# Patient Record
Sex: Male | Born: 1985
Health system: Southern US, Community
[De-identification: ages and names within clinical notes are randomized; demographics above are authoritative.]

## PROBLEM LIST (undated history)

## (undated) DIAGNOSIS — J45909 Unspecified asthma, uncomplicated: Secondary | ICD-10-CM

## (undated) HISTORY — PX: OTHER SURGICAL HISTORY: SHX169

---
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
## 2007-08-16 ENCOUNTER — Emergency Department (HOSPITAL_COMMUNITY): Admission: EM | Admit: 2007-08-16 | Discharge: 2007-08-16 | Payer: Self-pay | Admitting: Neurosurgery

## 2008-04-09 ENCOUNTER — Emergency Department (HOSPITAL_BASED_OUTPATIENT_CLINIC_OR_DEPARTMENT_OTHER): Admission: EM | Admit: 2008-04-09 | Discharge: 2008-04-09 | Payer: Self-pay | Admitting: Emergency Medicine

## 2009-12-06 ENCOUNTER — Emergency Department (HOSPITAL_BASED_OUTPATIENT_CLINIC_OR_DEPARTMENT_OTHER): Admission: EM | Admit: 2009-12-06 | Discharge: 2009-12-06 | Payer: Self-pay | Admitting: Emergency Medicine

## 2009-12-13 IMAGING — CR DG CHEST 2V
2 series · 2 of 2 positions shown · non-contrast
Comparison: 09/12/2004

CLINICAL DATA: Cough, low grade fever, asthma, shortness of breath

CHEST - 2 VIEW

[w chest pa]
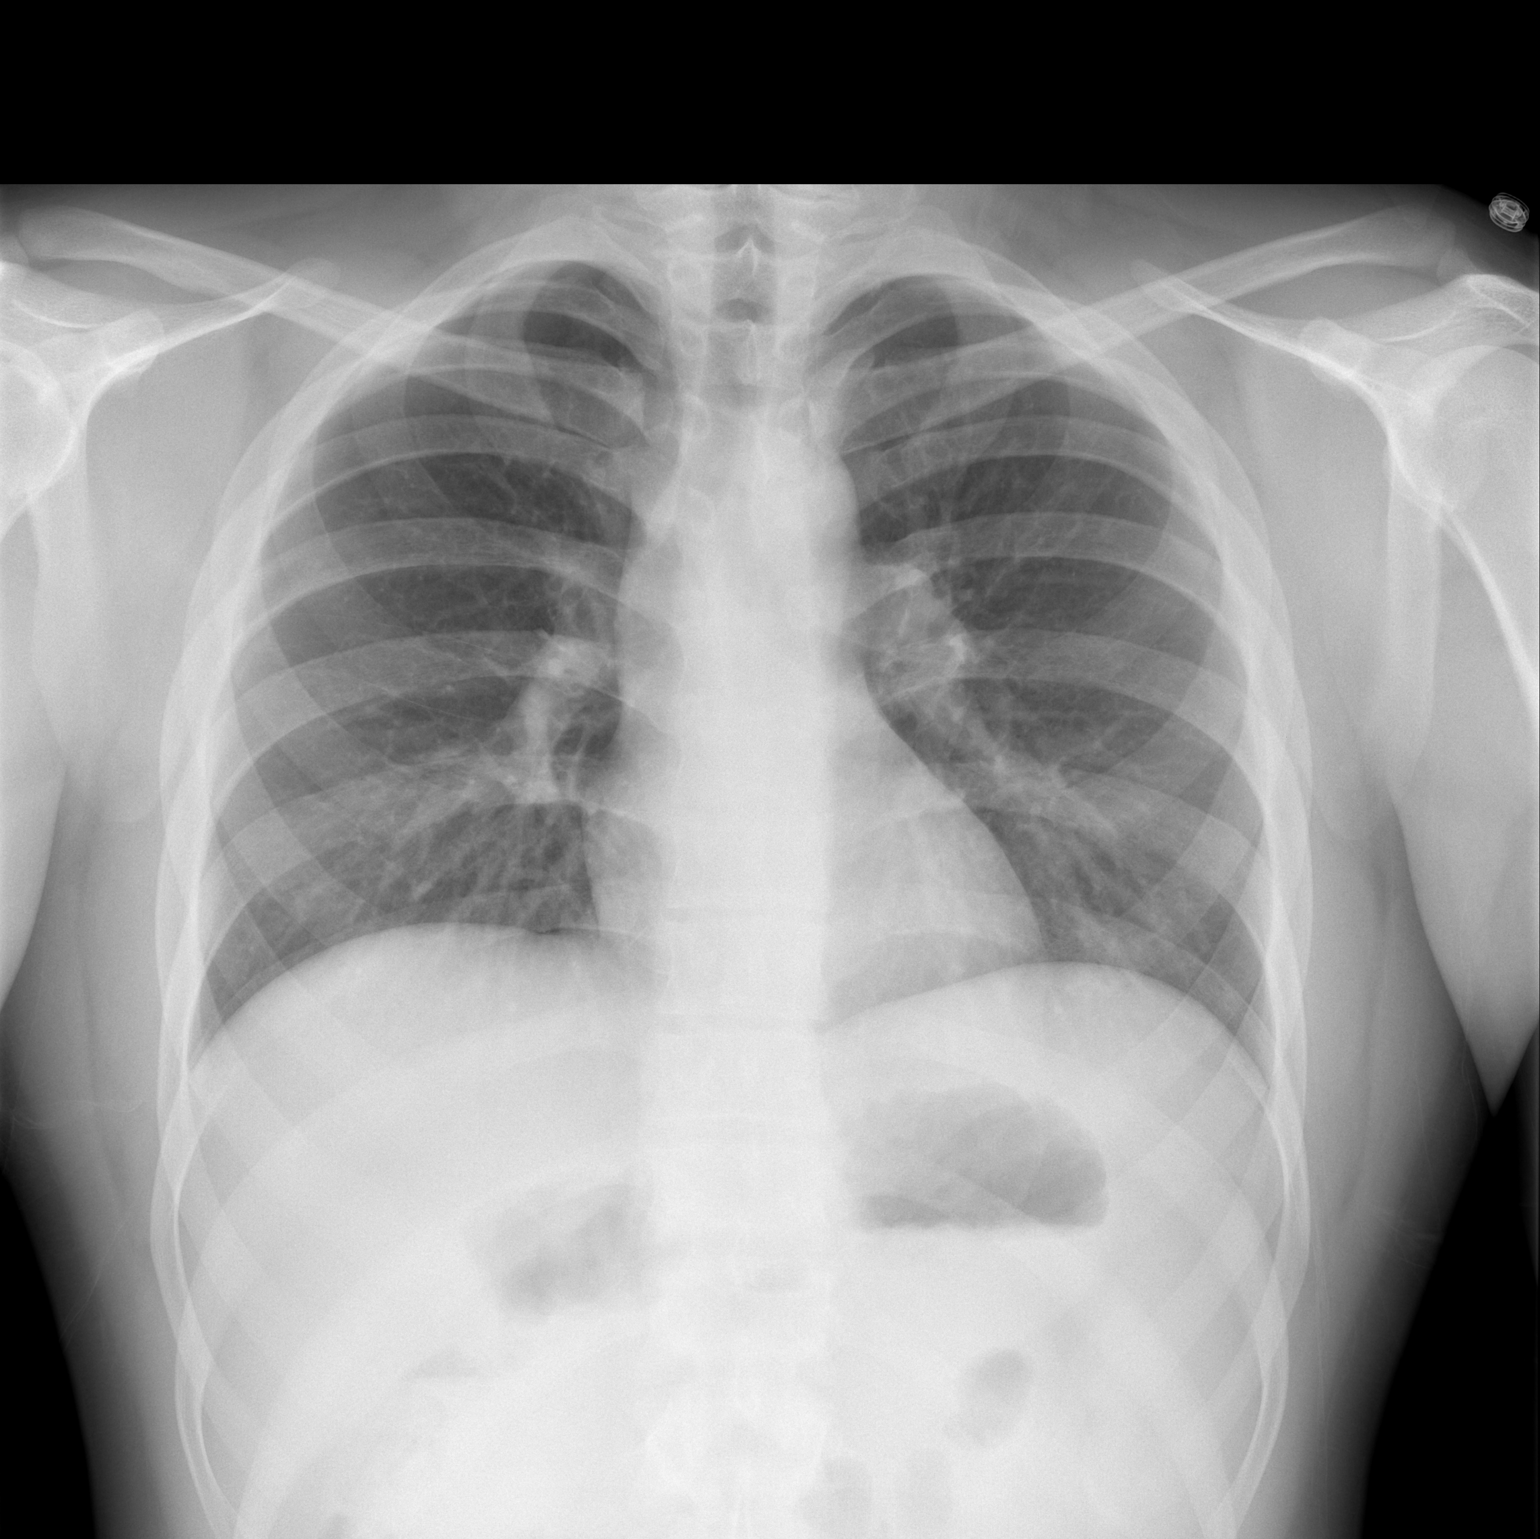

[w chest lat]
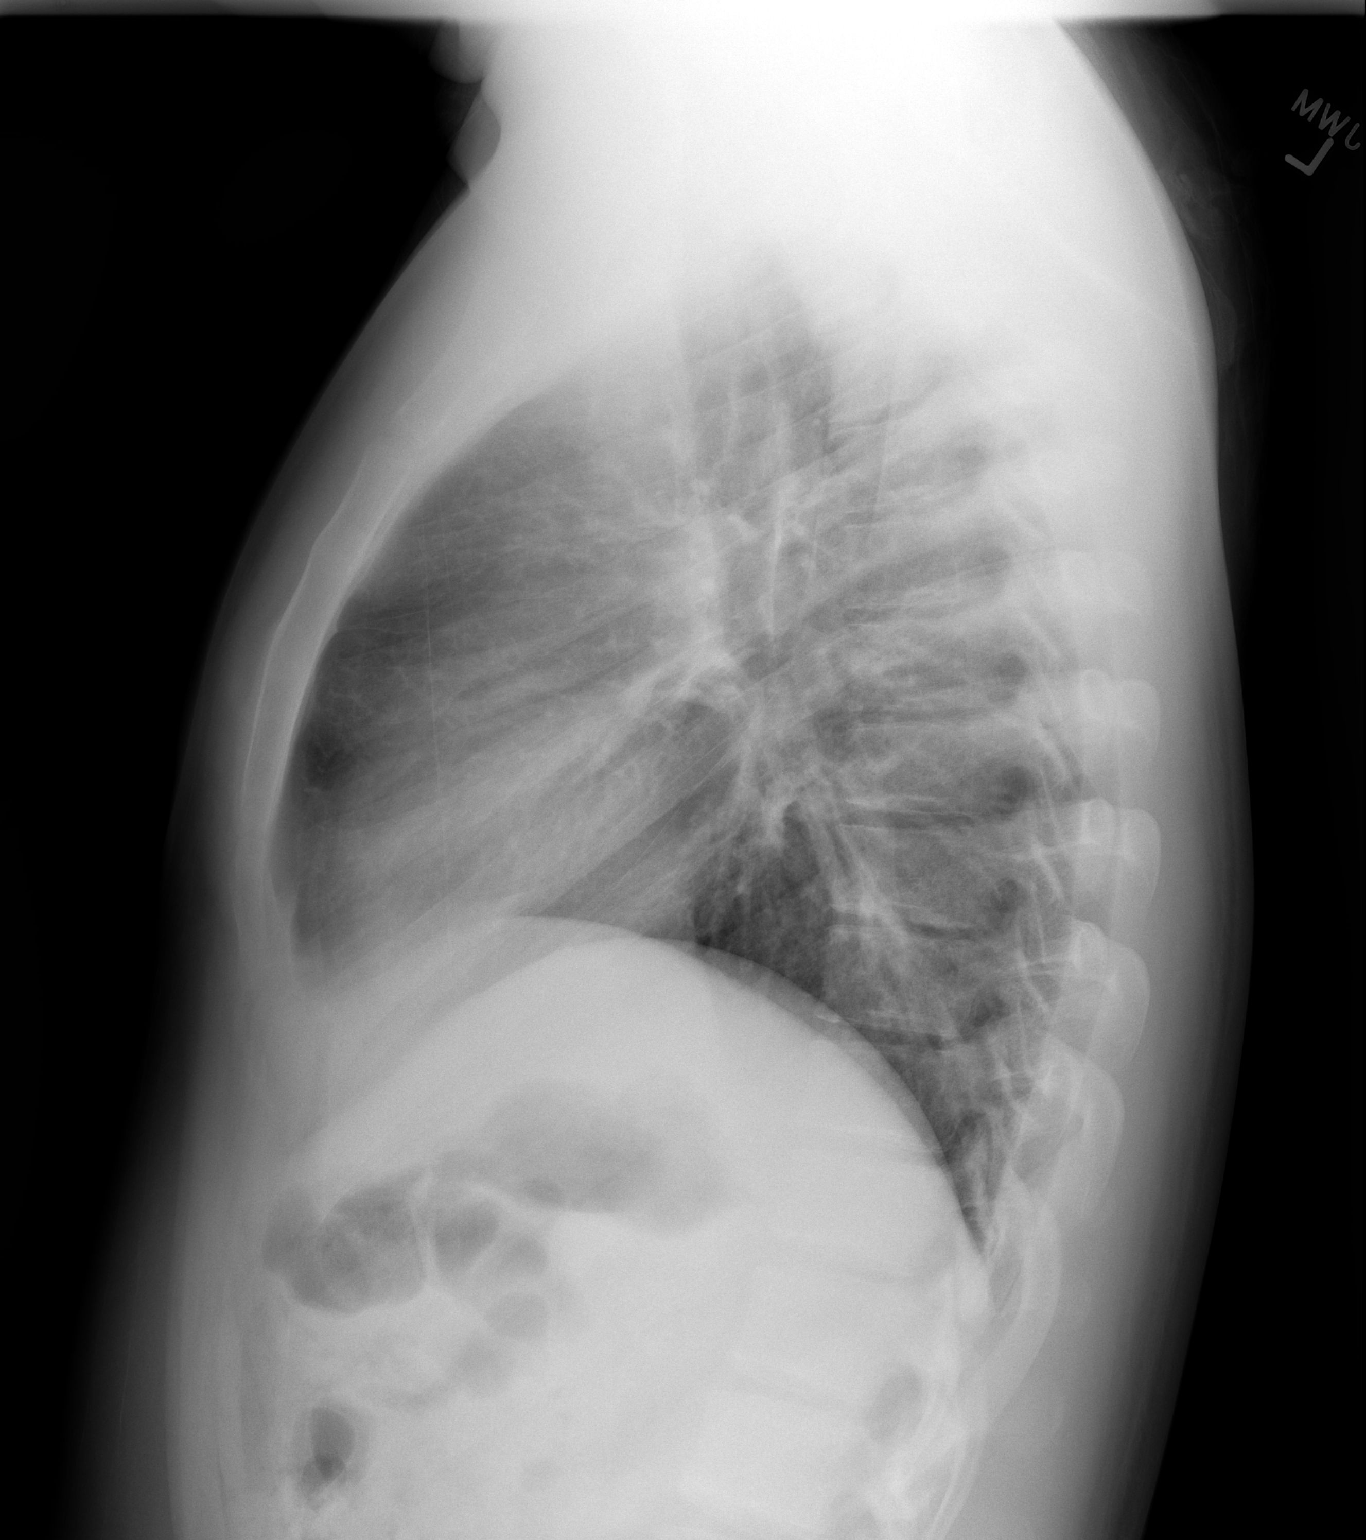

[2 of 2 positions shown; findings below may reference images not displayed]

FINDINGS: Normal heart size, mediastinal contours, and pulmonary vascularity.
Mild peribronchial thickening.
No pulmonary infiltrate, pleural effusion, or pneumothorax.
Bones unremarkable.
IMPRESSION: Minimal chronic bronchitic changes without acute infiltrate.
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
## 2010-11-05 NOTE — Discharge Summary (Signed)
NAME:  Brett Brock, Brett Brock              ACCOUNT NO.:  [id number]   MEDICAL RECORD NO.:  [id number]          PATIENT TYPE:  INP   LOCATION:  6149                         FACILITY:  [facility]   PHYSICIAN:  Ermalinda Barrios, M.D.  DATE OF BIRTH:  1985/08/12   DATE OF ADMISSION:  09/12/2004  DATE OF DISCHARGE:  09/13/2004                                 DISCHARGE SUMMARY   HOSPITAL COURSE:  A 25 year old male admitted for asthma exacerbation,  received prednisone and albuterol in ED, still with respiratory distress.  Started on albuterol q.4h. with q.2 p.r.n. and oxygen to keep saturation  above 92%.  Also started maintenance IV fluids and allowed regular diet as  tolerated.  On day of discharge the patient was breathing comfortably with  no oxygen requirement.  He did not require p.r.n. albuterol overnight.  Strep test was positive.  He was treated with Bicillin x1.  The patient had  continued throat soreness and was given ibuprofen and Chloraseptic spray for  pain.   OPERATIONS AND PROCEDURES:  Chest x-ray showed mild hypoinflation, no  infiltrate.  Rapid strep test positive.  Chem labs within normal limits.   DIAGNOSES:  Asthma exacerbation, strep throat.   MEDICATIONS:  1.  Albuterol inhaler q.4-6h. p.r.n.  2. Flovent 44 mcg two puffs b.i.d.  3.      Prednisone 60 mg daily x3 days.   DISCHARGE WEIGHT:  94.2 kg.   DISCHARGE CONDITION:  Good.   DISCHARGE INSTRUCTIONS:  Follow up with Dr. Alita Chyle on Tuesday, 4/4 at  9:40 a.m.  Daily peak flows.      PR/MEDQ  D:  09/13/2004  T:  09/13/2004  Job:  [id number]

## 2011-03-11 LAB — INFLUENZA A AND B ANTIGEN (CONVERTED LAB): Influenza B Ag: NEGATIVE

## 2018-01-21 ENCOUNTER — Encounter (HOSPITAL_COMMUNITY): Payer: Self-pay | Admitting: *Deleted

## 2018-01-21 ENCOUNTER — Other Ambulatory Visit: Payer: Self-pay

## 2018-01-21 ENCOUNTER — Emergency Department (HOSPITAL_COMMUNITY)
Admission: EM | Admit: 2018-01-21 | Discharge: 2018-01-21 | Disposition: A | Discharge status: Home / Self Care | Payer: Self-pay | Attending: Emergency Medicine | Admitting: Emergency Medicine

## 2018-01-21 DIAGNOSIS — J45909 Unspecified asthma, uncomplicated: Secondary | ICD-10-CM | POA: Insufficient documentation

## 2018-01-21 DIAGNOSIS — K2970 Gastritis, unspecified, without bleeding: Secondary | ICD-10-CM | POA: Insufficient documentation

## 2018-01-21 HISTORY — DX: Unspecified asthma, uncomplicated: J45.909

## 2018-01-21 LAB — CBC WITH DIFFERENTIAL/PLATELET
BASOS ABS: 0.1 10*3/uL (ref 0.0–0.1)
BASOS PCT: 2 %
EOS ABS: 0.4 10*3/uL (ref 0.0–0.7)
Eosinophils Relative: 5 %
HEMATOCRIT: 48.6 % (ref 39.0–52.0)
HEMOGLOBIN: 16 g/dL (ref 13.0–17.0)
Lymphocytes Relative: 22 %
Lymphs Abs: 2 10*3/uL (ref 0.7–4.0)
MCH: 32.1 pg (ref 26.0–34.0)
MCHC: 32.9 g/dL (ref 30.0–36.0)
MCV: 97.6 fL (ref 78.0–100.0)
MONOS PCT: 10 %
Monocytes Absolute: 0.9 10*3/uL (ref 0.1–1.0)
NEUTROS ABS: 5.5 10*3/uL (ref 1.7–7.7)
NEUTROS PCT: 61 %
Platelets: 279 10*3/uL (ref 150–400)
RBC: 4.98 MIL/uL (ref 4.22–5.81)
RDW: 14.1 % (ref 11.5–15.5)
WBC: 8.9 10*3/uL (ref 4.0–10.5)

## 2018-01-21 LAB — COMPREHENSIVE METABOLIC PANEL
ALBUMIN: 3.7 g/dL (ref 3.5–5.0)
ALK PHOS: 93 U/L (ref 38–126)
ALT: 121 U/L — AB (ref 0–44)
AST: 146 U/L — AB (ref 15–41)
Anion gap: 12 (ref 5–15)
BILIRUBIN TOTAL: 1 mg/dL (ref 0.3–1.2)
BUN: 6 mg/dL (ref 6–20)
CALCIUM: 9.2 mg/dL (ref 8.9–10.3)
CO2: 25 mmol/L (ref 22–32)
CREATININE: 0.64 mg/dL (ref 0.61–1.24)
Chloride: 104 mmol/L (ref 98–111)
GFR calc Af Amer: >60 mL/min (ref >60)
GFR calc non Af Amer: >60 mL/min (ref >60)
GLUCOSE: 126 mg/dL — AB (ref 70–99)
Potassium: 3.3 mmol/L — ABNORMAL LOW (ref 3.5–5.1)
SODIUM: 141 mmol/L (ref 135–145)
TOTAL PROTEIN: 7.4 g/dL (ref 6.5–8.1)

## 2018-01-21 NOTE — ED Triage Notes (Signed)
Pt reports he was lying in bed about and hour ago and suddenly became nauseated and vomited x1. Reports some blood in his emesis. Says he feels a burning in his throat "like acid reflux"

## 2018-01-21 NOTE — ED Notes (Signed)
Bed: WA06 Expected date:  Expected time:  Means of arrival:  Comments: 

## 2018-01-21 NOTE — ED Notes (Signed)
Pt tolerated PO challenge

## 2018-01-21 NOTE — ED Provider Notes (Signed)
[facility]-EMERGENCY DEPT Provider Note   CSN: [id number] Arrival date & time: 01/21/18  0406     History   Chief Complaint Chief Complaint  Patient presents with  . Emesis    HPI Brett Brock is a 32 y.o. male with a past medical history of asthma, alcohol abuse presents to ED for evaluation of one episode of nausea and blood-tinged emesis that occurred prior to arrival.  States that he was laying in bed when the symptoms suddenly happened.  States that he had a discomfort in his abdomen when he had the nausea which improved when he vomited.  Patient states that "I did not want to come, but my wife and mom made me."  He denies any symptoms currently.  He states that his blood pressure is always high at the doctor's office and because "my wife is stressing me out."  Denies any diarrhea, constipation, fever, current abdominal pain, sick contacts with similar symptoms, chest pain or shortness of breath.  He has not taken any medicine to help with his symptoms.  Patient reports drinking 2-3 beers and 10 shots of whiskey daily. States "I only drank 2 beers last night."  HPI  Past Medical History:  Diagnosis Date  . Asthma     There are no active problems to display for this patient.   History reviewed. No pertinent surgical history.      Home Medications    Prior to Admission medications   Medication Sig Start Date End Date Taking? Authorizing Provider  ALBUTEROL IN Inhale 2 puffs into the lungs daily as needed (beathing difficulties).   Yes [provider]    Family History No family history on file.  Social History Social History   Tobacco Use  . Smoking status: Never Smoker  . Smokeless tobacco: Never Used  Substance Use Topics  . Alcohol use: Yes  . Drug use: Never     Allergies   Bee venom   Review of Systems Review of Systems  Constitutional: Negative for appetite change, chills and fever.  HENT: Negative for ear pain,  rhinorrhea, sneezing and sore throat.   Eyes: Negative for photophobia and visual disturbance.  Respiratory: Negative for cough, chest tightness, shortness of breath and wheezing.   Cardiovascular: Negative for chest pain and palpitations.  Gastrointestinal: Positive for abdominal pain, nausea and vomiting. Negative for blood in stool, constipation and diarrhea.  Genitourinary: Negative for dysuria, hematuria and urgency.  Musculoskeletal: Negative for myalgias.  Skin: Negative for rash.  Neurological: Negative for dizziness, weakness and light-headedness.     Physical Exam Updated Vital Signs BP (!) 170/99 (BP Location: Right Arm) Comment: Notifed PA before Pt's departure   Pulse 91   Temp 98.4 F (36.9 C) (Oral)   Resp 16   Ht 6' (1.829 m)   Wt 122.5 kg (270 lb)   SpO2 100%   BMI 36.62 kg/m   Physical Exam  Constitutional: He appears well-developed and well-nourished. No distress.  HENT:  Head: Normocephalic and atraumatic.  Nose: Nose normal.  Eyes: Conjunctivae and EOM are normal. Right eye exhibits no discharge. Left eye exhibits no discharge. No scleral icterus.  Neck: Normal range of motion. Neck supple.  Cardiovascular: Normal rate, regular rhythm, normal heart sounds and intact distal pulses. Exam reveals no gallop and no friction rub.  No murmur heard. Pulmonary/Chest: Effort normal and breath sounds normal. No respiratory distress.  Abdominal: Soft. Bowel sounds are normal. He exhibits no distension. There is  no tenderness. There is no guarding.  No abdominal TTP. Specifically, no RUQ TTP.  Musculoskeletal: Normal range of motion. He exhibits no edema.  Neurological: He is alert. He exhibits normal muscle tone. Coordination normal.  Skin: Skin is warm and dry. No rash noted.  Psychiatric: He has a normal mood and affect.  Nursing note and vitals reviewed.    ED Treatments / Results  Labs (all labs ordered are listed, but only abnormal results are  displayed) Labs Reviewed  COMPREHENSIVE METABOLIC PANEL - Abnormal; Notable for the following components:      Result Value   Potassium 3.3 (*)    Glucose, Bld 126 (*)    AST 146 (*)    ALT 121 (*)    All other components within normal limits  CBC WITH DIFFERENTIAL/PLATELET    EKG None  Radiology No results found.  Procedures Procedures (including critical care time)  Medications Ordered in ED Medications - No data to display   Initial Impression / Assessment and Plan / ED Course  I have reviewed the triage vital signs and the nursing notes.  Pertinent labs & imaging results that were available during my care of the patient were reviewed by me and considered in my medical decision making (see chart for details).     32 year old male with past medical history of asthma, alcohol abuse presents to ED for evaluation of 1 episode of nausea and blood-tinged emesis prior to arrival.  Upon arrival to the ED, patient states that his symptoms have completely resolved.  He is able to tolerate p.o. intake without difficulty.  Patient is hypertensive but states that this is secondary to stress and being at the hospital.  Lab work significant for AST and ALT elevation.  CBC unremarkable.  His abdomen is soft, nontender and nondistended.  LFT elevations could be explained by his daily alcohol use which he endorses past 2-3 beers daily and 10 shots of whiskey.  I offered him a right upper quadrant ultrasound to evaluate for gallbladder pathology with patient does not feel that any further work-up or imaging is warranted.  States that he was hospitalized for pneumonia at the beginning of the year and "they checked everything out and told me I was okay, I just had a fatty liver." I suspect symptoms could be 2/2 alcohol use.  He is able to tolerate p.o. intake without difficulty.  Will advise him to follow-up with PCP and to return to ED for any severe worsening symptoms. Doubt emergent/surgical cause  of symptoms. Patient's blood pressure initially elevated in the emergency department today. Patient denies headache, change in vision, numbness, weakness, chest pain, dyspnea, dizziness, or lightheadedness therefore doubt hypertensive emergency. Discussed elevated blood pressure with the patient and the need for primary care follow up with potential need to initiate or change antihypertensive medications and or for further evaluation. Discussed return precaution signs/symptoms for hypertensive emergency as listed above with the patient.   Portions of this note were generated with Scientist, clinical (histocompatibility and immunogenetics)Dragon dictation software. Dictation errors may occur despite best attempts at proofreading.   Final Clinical Impressions(s) / ED Diagnoses   Final diagnoses:  Gastritis without bleeding, unspecified chronicity, unspecified gastritis type    ED Discharge Orders    None       Dietrich PatesKhatri, Jeslyn Amsler, PA-C 01/21/18 [id number]    Dione BoozeGlick, David, MD 01/21/18 [phone]

## 2018-01-21 NOTE — Discharge Instructions (Signed)
Return to ED for worsening symptoms, increased vomiting, severe abdominal pain, chest pain, lightheadedness or loss of consciousness.

## 2018-11-07 ENCOUNTER — Emergency Department (HOSPITAL_COMMUNITY): Payer: Self-pay

## 2018-11-07 ENCOUNTER — Emergency Department (HOSPITAL_COMMUNITY)
Admission: EM | Admit: 2018-11-07 | Discharge: 2018-11-07 | Disposition: A | Discharge status: Home / Self Care | Payer: Self-pay | Attending: Emergency Medicine | Admitting: Emergency Medicine

## 2018-11-07 ENCOUNTER — Other Ambulatory Visit: Payer: Self-pay

## 2018-11-07 ENCOUNTER — Encounter (HOSPITAL_COMMUNITY): Payer: Self-pay | Admitting: Emergency Medicine

## 2018-11-07 DIAGNOSIS — R0602 Shortness of breath: Secondary | ICD-10-CM | POA: Insufficient documentation

## 2018-11-07 DIAGNOSIS — E669 Obesity, unspecified: Secondary | ICD-10-CM | POA: Insufficient documentation

## 2018-11-07 DIAGNOSIS — Z6838 Body mass index (BMI) 38.0-38.9, adult: Secondary | ICD-10-CM | POA: Insufficient documentation

## 2018-11-07 DIAGNOSIS — G4730 Sleep apnea, unspecified: Secondary | ICD-10-CM | POA: Insufficient documentation

## 2018-11-07 DIAGNOSIS — L03116 Cellulitis of left lower limb: Secondary | ICD-10-CM | POA: Insufficient documentation

## 2018-11-07 DIAGNOSIS — R609 Edema, unspecified: Secondary | ICD-10-CM

## 2018-11-07 LAB — CBC WITH DIFFERENTIAL/PLATELET
Abs Immature Granulocytes: 0.02 10*3/uL (ref 0.00–0.07)
Basophils Absolute: 0.1 10*3/uL (ref 0.0–0.1)
Basophils Relative: 2 %
Eosinophils Absolute: 0.3 10*3/uL (ref 0.0–0.5)
Eosinophils Relative: 4 %
HCT: 47.5 % (ref 39.0–52.0)
Hemoglobin: 15.4 g/dL (ref 13.0–17.0)
Immature Granulocytes: 0 %
Lymphocytes Relative: 24 %
Lymphs Abs: 1.7 10*3/uL (ref 0.7–4.0)
MCH: 31.4 pg (ref 26.0–34.0)
MCHC: 32.4 g/dL (ref 30.0–36.0)
MCV: 96.9 fL (ref 80.0–100.0)
Monocytes Absolute: 0.6 10*3/uL (ref 0.1–1.0)
Monocytes Relative: 9 %
Neutro Abs: 4.1 10*3/uL (ref 1.7–7.7)
Neutrophils Relative %: 61 %
Platelets: 205 10*3/uL (ref 150–400)
RBC: 4.9 MIL/uL (ref 4.22–5.81)
RDW: 14.1 % (ref 11.5–15.5)
WBC: 6.8 10*3/uL (ref 4.0–10.5)
nRBC: 0 % (ref 0.0–0.2)

## 2018-11-07 LAB — COMPREHENSIVE METABOLIC PANEL
ALT: 187 U/L — ABNORMAL HIGH (ref 0–44)
AST: 236 U/L — ABNORMAL HIGH (ref 15–41)
Albumin: 3.6 g/dL (ref 3.5–5.0)
Alkaline Phosphatase: 101 U/L (ref 38–126)
Anion gap: 13 (ref 5–15)
BUN: 5 mg/dL — ABNORMAL LOW (ref 6–20)
CO2: 23 mmol/L (ref 22–32)
Calcium: 8.7 mg/dL — ABNORMAL LOW (ref 8.9–10.3)
Chloride: 103 mmol/L (ref 98–111)
Creatinine, Ser: 0.65 mg/dL (ref 0.61–1.24)
GFR calc Af Amer: >60 mL/min (ref >60)
GFR calc non Af Amer: >60 mL/min (ref >60)
Glucose, Bld: 173 mg/dL — ABNORMAL HIGH (ref 70–99)
Potassium: 3.2 mmol/L — ABNORMAL LOW (ref 3.5–5.1)
Sodium: 139 mmol/L (ref 135–145)
Total Bilirubin: 1 mg/dL (ref 0.3–1.2)
Total Protein: 7.2 g/dL (ref 6.5–8.1)

## 2018-11-07 LAB — D-DIMER, QUANTITATIVE (NOT AT ARMC): D-Dimer, Quant: 0.48 ug/mL-FEU (ref 0.00–0.50)

## 2018-11-07 MED ORDER — CEPHALEXIN 500 MG PO CAPS: 500.0000 mg | ORAL_CAPSULE | Freq: Four times a day (QID) | ORAL | 0 refills | Status: AC

## 2018-11-07 MED ORDER — CEPHALEXIN 250 MG PO CAPS
500.0000 mg | ORAL_CAPSULE | Freq: Once | ORAL | Status: AC
  Administered 2018-11-07: 500 mg via ORAL
  Filled 2018-11-07: qty 2

## 2018-11-07 MED ORDER — ALBUTEROL SULFATE HFA 108 (90 BASE) MCG/ACT IN AERS
6.0000 | INHALATION_SPRAY | Freq: Once | RESPIRATORY_TRACT | Status: AC
  Administered 2018-11-07: 8 via RESPIRATORY_TRACT
  Filled 2018-11-07: qty 6.7

## 2018-11-07 NOTE — ED Provider Notes (Signed)
[facility]   CSN: [id number] Arrival date & time: 11/07/18  1539    History   Chief Complaint Chief Complaint  Patient presents with  . Leg Swelling    HPI Brett Brock is a 33 y.o. male.  With past medical history of asthma, alcohol abuse, sleep apnea on CPAP who presents to the ED with bilateral lower extremity swelling left worse than right.  States the swelling and redness started about 2 weeks ago and is slowly crept up his leg.  States last night he did not sleep with his CPAP machine and today has been short of breath.  Tried using his albuterol inhaler with minimal improvement.  Denies any fevers, cough, congestion, chest pain.      HPI  Past Medical History:  Diagnosis Date  . Asthma     There are no active problems to display for this patient.   History reviewed. No pertinent surgical history.      Home Medications    Prior to Admission medications   Medication Sig Start Date End Date Taking? Authorizing Provider  albuterol (VENTOLIN HFA) 108 (90 Base) MCG/ACT inhaler Inhale 1-2 puffs into the lungs every 6 (six) hours as needed for wheezing or shortness of breath.   Yes [provider]  cephALEXin (KEFLEX) 500 MG capsule Take 1 capsule (500 mg total) by mouth 4 (four) times daily for 7 days. 11/07/18 11/14/18  Dicky DoeFord, Emmali Karow, MD    Family History No family history on file.  Social History Social History   Tobacco Use  . Smoking status: Never Smoker  . Smokeless tobacco: Never Used  Substance Use Topics  . Alcohol use: Yes  . Drug use: Never     Allergies   Bee venom   Review of Systems Review of Systems  Constitutional: Negative for activity change, appetite change, chills and fever.  HENT: Negative for ear pain and sore throat.   Eyes: Negative for pain and visual disturbance.  Respiratory: Positive for shortness of breath. Negative for cough.   Cardiovascular: Negative for  chest pain and palpitations.  Gastrointestinal: Negative for abdominal pain, nausea and vomiting.  Genitourinary: Negative for dysuria and hematuria.  Musculoskeletal: Negative for arthralgias and back pain.       Bilateral lower extremity swelling. L>R with erythema to the left  Skin: Negative for color change and rash.  Neurological: Negative for seizures and syncope.  All other systems reviewed and are negative.    Physical Exam Updated Vital Signs BP (!) 154/110   Pulse 90   Temp 98 F (36.7 C) (Oral)   Resp 19   Ht 6' (1.829 m)   Wt 129.3 kg   SpO2 99%   BMI 38.65 kg/m   Physical Exam Vitals signs and nursing note reviewed.  Constitutional:      Appearance: He is well-developed. He is obese.  HENT:     Head: Normocephalic and atraumatic.  Eyes:     Conjunctiva/sclera: Conjunctivae normal.  Neck:     Musculoskeletal: Neck supple.  Cardiovascular:     Rate and Rhythm: Normal rate and regular rhythm.     Heart sounds: No murmur.  Pulmonary:     Effort: No respiratory distress.     Breath sounds: Normal breath sounds. No wheezing.     Comments: Tachypneic  Abdominal:     Palpations: Abdomen is soft.     Tenderness: There is no abdominal tenderness.  Musculoskeletal:  General: Swelling and tenderness present.     Comments: Bilateral lower extremity swelling below the knee with the left greater than the right with associated erythema. no signs of trauma to the area.  Skin:    General: Skin is warm and dry.  Neurological:     Mental Status: He is alert.      ED Treatments / Results  Labs (all labs ordered are listed, but only abnormal results are displayed) Labs Reviewed  COMPREHENSIVE METABOLIC PANEL - Abnormal; Notable for the following components:      Result Value   Potassium 3.2 (*)    Glucose, Bld 173 (*)    BUN 5 (*)    Calcium 8.7 (*)    AST 236 (*)    ALT 187 (*)    All other components within normal limits  CBC WITH  DIFFERENTIAL/PLATELET  D-DIMER, QUANTITATIVE (NOT AT [facility])    EKG EKG Interpretation  Date/Time:  Wednesday Nov 07 2018 15:47:46 EDT Ventricular Rate:  102 PR Interval:  142 QRS Duration: 82 QT Interval:  364 QTC Calculation: 474 R Axis:   66 Text Interpretation:  Sinus tachycardia no acute ST/T changes No old tracing to compare Confirmed by Pricilla Loveless [phone]) on 11/07/2018 8:04:32 PM   Radiology Dg Chest 2 View  Result Date: 11/07/2018 CLINICAL DATA:  Shortness of breath EXAM: CHEST - 2 VIEW COMPARISON:  April 09, 2008. FINDINGS: There is no appreciable edema or consolidation. Heart is upper normal in size with pulmonary vascularity normal. No adenopathy. No bone lesions. IMPRESSION: No edema or consolidation.  Heart upper normal in size. Electronically Signed   By: Bretta Bang III M.D.   On: 11/07/2018 16:23    Procedures Procedures (including critical care time)  Medications Ordered in ED Medications  albuterol (VENTOLIN HFA) 108 (90 Base) MCG/ACT inhaler 6-8 puff (8 puffs Inhalation Given 11/07/18 2045)  cephALEXin (KEFLEX) capsule 500 mg (500 mg Oral Given 11/07/18 2045)     Initial Impression / Assessment and Plan / ED Course  I have reviewed the triage vital signs and the nursing notes.  Pertinent labs & imaging results that were available during my care of the patient were reviewed by me and considered in my medical decision making (see chart for details).  Brett Brock is a 33 y.o. male.  With past medical history of asthma, alcohol abuse, sleep apnea on CPAP who presents to the ED with bilateral lower extremity swelling left worse than right.   On initial exam patient is slightly tachypneic with a heart rate in the 90s.  Exam as above shows bilateral lower extremity swelling distal to the knees with left greater than right.  The left has erythema extending up the calf.  In regards to shortness of breath patient has no wheezing and is moving air well  on exam albuterol inhaler given with mild improvement in symptoms.  Exam not consistent with asthma exacerbation.  D-dimer is negative patient is low to moderate [place] therefore likely PE.  Shortness of breath likely secondary to patient not sleeping with his CPAP last night.   Lower extremity more consistent with chronic venous stasis with superimposed left lower extremity cellulitis.  Patient given dose of Keflex in the ED and will be discharged with a 7-day course of Keflex.  Strict return precautions given to patient.  Information given per PCP as patient does not have one.  Advised patient that his blood pressure was persistently elevated during this visit and that he  needs close follow-up which patient states he will do.  Patient discharged home in stable condition.  All questions answered.   Final Clinical Impressions(s) / ED Diagnoses   Final diagnoses:  Cellulitis of left lower extremity  Peripheral edema    ED Discharge Orders         Ordered    cephALEXin (KEFLEX) 500 MG capsule  4 times daily     11/07/18 2104           Dicky Doe, MD 11/07/18 4081    Pricilla Loveless, MD 11/09/18 2016

## 2018-11-07 NOTE — ED Triage Notes (Signed)
Pt to ED with c/o bil lower leg swelling and redness x's 2 days.  Pt also c/o shortness of breath, hx of asthma  Pt uses c-pap at night but did not use it last night.

## 2018-11-07 NOTE — Discharge Instructions (Signed)
Blood pressure was elevated while in the ED. Please follow up with a PCP.

## 2019-05-14 ENCOUNTER — Encounter (HOSPITAL_COMMUNITY): Payer: Self-pay

## 2019-05-14 ENCOUNTER — Emergency Department (HOSPITAL_COMMUNITY): Payer: Self-pay

## 2019-05-14 ENCOUNTER — Emergency Department (HOSPITAL_COMMUNITY)
Admission: EM | Admit: 2019-05-14 | Discharge: 2019-05-15 | Disposition: A | Discharge status: Home / Self Care | Payer: Self-pay | Attending: Emergency Medicine | Admitting: Emergency Medicine

## 2019-05-14 ENCOUNTER — Other Ambulatory Visit: Payer: Self-pay

## 2019-05-14 DIAGNOSIS — R0789 Other chest pain: Secondary | ICD-10-CM | POA: Insufficient documentation

## 2019-05-14 DIAGNOSIS — F1490 Cocaine use, unspecified, uncomplicated: Secondary | ICD-10-CM | POA: Insufficient documentation

## 2019-05-14 DIAGNOSIS — Z8709 Personal history of other diseases of the respiratory system: Secondary | ICD-10-CM | POA: Insufficient documentation

## 2019-05-14 DIAGNOSIS — R0602 Shortness of breath: Secondary | ICD-10-CM | POA: Insufficient documentation

## 2019-05-14 DIAGNOSIS — I10 Essential (primary) hypertension: Secondary | ICD-10-CM | POA: Insufficient documentation

## 2019-05-14 DIAGNOSIS — F1020 Alcohol dependence, uncomplicated: Secondary | ICD-10-CM | POA: Insufficient documentation

## 2019-05-14 LAB — URINALYSIS, ROUTINE W REFLEX MICROSCOPIC
Bacteria, UA: NONE SEEN
Bilirubin Urine: NEGATIVE
Glucose, UA: NEGATIVE mg/dL
Hgb urine dipstick: NEGATIVE
Ketones, ur: NEGATIVE mg/dL
Leukocytes,Ua: NEGATIVE
Nitrite: NEGATIVE
Protein, ur: 30 mg/dL — AB
Specific Gravity, Urine: 1.017 (ref 1.005–1.030)
pH: 8 (ref 5.0–8.0)

## 2019-05-14 LAB — BRAIN NATRIURETIC PEPTIDE: B Natriuretic Peptide: 26.3 pg/mL (ref 0.0–100.0)

## 2019-05-14 LAB — HEPATIC FUNCTION PANEL
ALT: 141 U/L — ABNORMAL HIGH (ref 0–44)
AST: 195 U/L — ABNORMAL HIGH (ref 15–41)
Albumin: 3.7 g/dL (ref 3.5–5.0)
Alkaline Phosphatase: 97 U/L (ref 38–126)
Bilirubin, Direct: 0.6 mg/dL — ABNORMAL HIGH (ref 0.0–0.2)
Indirect Bilirubin: 1.8 mg/dL — ABNORMAL HIGH (ref 0.3–0.9)
Total Bilirubin: 2.4 mg/dL — ABNORMAL HIGH (ref 0.3–1.2)
Total Protein: 7.5 g/dL (ref 6.5–8.1)

## 2019-05-14 LAB — RAPID URINE DRUG SCREEN, HOSP PERFORMED
Amphetamines: NOT DETECTED
Barbiturates: NOT DETECTED
Benzodiazepines: NOT DETECTED
Cocaine: POSITIVE — AB
Opiates: NOT DETECTED
Tetrahydrocannabinol: NOT DETECTED

## 2019-05-14 LAB — BASIC METABOLIC PANEL
Anion gap: 12 (ref 5–15)
BUN: 5 mg/dL — ABNORMAL LOW (ref 6–20)
CO2: 26 mmol/L (ref 22–32)
Calcium: 9.3 mg/dL (ref 8.9–10.3)
Chloride: 100 mmol/L (ref 98–111)
Creatinine, Ser: 0.77 mg/dL (ref 0.61–1.24)
GFR calc Af Amer: >60 mL/min (ref >60)
GFR calc non Af Amer: >60 mL/min (ref >60)
Glucose, Bld: 160 mg/dL — ABNORMAL HIGH (ref 70–99)
Potassium: 3.4 mmol/L — ABNORMAL LOW (ref 3.5–5.1)
Sodium: 138 mmol/L (ref 135–145)

## 2019-05-14 LAB — CBC
HCT: 46.4 % (ref 39.0–52.0)
Hemoglobin: 15.3 g/dL (ref 13.0–17.0)
MCH: 31.6 pg (ref 26.0–34.0)
MCHC: 33 g/dL (ref 30.0–36.0)
MCV: 95.9 fL (ref 80.0–100.0)
Platelets: 162 10*3/uL (ref 150–400)
RBC: 4.84 MIL/uL (ref 4.22–5.81)
RDW: 14.6 % (ref 11.5–15.5)
WBC: 8.4 10*3/uL (ref 4.0–10.5)
nRBC: 0 % (ref 0.0–0.2)

## 2019-05-14 LAB — ETHANOL: Alcohol, Ethyl (B): <10 mg/dL (ref <10)

## 2019-05-14 LAB — D-DIMER, QUANTITATIVE: D-Dimer, Quant: 0.5 ug/mL-FEU (ref 0.00–0.50)

## 2019-05-14 LAB — TROPONIN I (HIGH SENSITIVITY)
Troponin I (High Sensitivity): 22 ng/L — ABNORMAL HIGH (ref <18)
Troponin I (High Sensitivity): 22 ng/L — ABNORMAL HIGH (ref <18)

## 2019-05-14 NOTE — ED Triage Notes (Signed)
Pt reports when he woke up this morning he felt like his heart was racing. Pt tested positive for COVID on Oct 26th. Pt still having shortness of breath. Pt a.o, resp e.u at this time.

## 2019-05-14 NOTE — Discharge Instructions (Signed)
Please make sure you are taking a multivitamin containing thiamine every day. If your symptoms worsen or you have additional concerns please seek additional medical care and evaluation. It is very important that you follow-up with the health and wellness center for repeat evaluation and your blood pressure, and assistance for a sleep apnea machine.

## 2019-05-14 NOTE — ED Notes (Signed)
Pt falling asleep while talking.  Pt asked when IV was going to be placed.  I informed pt that I had just finished putting it in.

## 2019-05-14 NOTE — ED Provider Notes (Signed)
[facility] EMERGENCY DEPARTMENT Provider Note   CSN: [id number] Arrival date & time: 05/14/19  1452     History   Chief Complaint Chief Complaint  Patient presents with   COVID+   Shortness of Breath   Chest Pain    HPI Brett Brock is a 33 y.o. male with past medical history of alcohol use, who presents today for evaluation of shortness of breath and chest pain.  He tested positive for Covid on 04/14/2019.  He reports that he felt like he was 90+ percent recovered.  He had regained his sense of taste and smell and felt much better.  He states that this morning when he woke up he had chest pain and felt short of breath.  He states that he is supposed to be using a CPAP machine at night for sleep apnea however he has not been compliant.  He denies any current cough, nausea, vomiting, or diarrhea.   He does report that he drinks 1/5 of liquor every day.  He states that he does not get withdrawal symptoms when he stops.  He also states that he uses cocaine, last use was 3 days ago. He denies any other substance use.  He denies any recent trauma.  No known sick contacts. He reports he has not had this before.      HPI  Past Medical History:  Diagnosis Date   Asthma     There are no active problems to display for this patient.   History reviewed. No pertinent surgical history.      Home Medications    Prior to Admission medications   Medication Sig Start Date End Date Taking? Authorizing Provider  albuterol (VENTOLIN HFA) 108 (90 Base) MCG/ACT inhaler Inhale 1-2 puffs into the lungs every 6 (six) hours as needed for wheezing or shortness of breath.    [provider]    Family History No family history on file.  Social History Social History   Tobacco Use   Smoking status: Never Smoker   Smokeless tobacco: Never Used  Substance Use Topics   Alcohol use: Yes   Drug use: Never     Allergies   Bee venom   Review of  Systems Review of Systems  Constitutional: Negative for chills and fever.  HENT: Negative for congestion.   Respiratory: Positive for shortness of breath. Negative for cough and chest tightness.   Cardiovascular: Positive for chest pain and leg swelling (bilateral ). Negative for palpitations.  Gastrointestinal: Negative for abdominal pain, diarrhea, nausea and vomiting.  Genitourinary: Negative for dysuria and urgency.  Musculoskeletal: Negative for back pain and neck pain.  Skin: Negative for color change, rash and wound.  Neurological: Negative for weakness and headaches.  Psychiatric/Behavioral: Negative for confusion.  All other systems reviewed and are negative.    Physical Exam Updated Vital Signs BP (!) 146/109    Pulse 67    Temp 98.9 F (37.2 C) (Oral)    Resp (!) 9    Ht 6' (1.829 m)    Wt 136.1 kg    SpO2 97%    BMI 40.69 kg/m   Physical Exam Vitals signs and nursing note reviewed.  Constitutional:      General: He is not in acute distress.    Appearance: He is well-developed. He is not toxic-appearing.  HENT:     Head: Normocephalic and atraumatic.  Eyes:     Conjunctiva/sclera: Conjunctivae normal.  Neck:     Musculoskeletal:  Neck supple.     Thyroid: No thyromegaly.     Vascular: No JVD.  Cardiovascular:     Rate and Rhythm: Normal rate and regular rhythm.     Heart sounds: No murmur.  Pulmonary:     Effort: Pulmonary effort is normal. No respiratory distress.     Breath sounds: Normal breath sounds. No decreased breath sounds or wheezing.  Chest:     Chest wall: No mass, tenderness or edema.  Abdominal:     Palpations: Abdomen is soft. There is no mass.     Tenderness: There is no abdominal tenderness.  Musculoskeletal:     Right lower leg: He exhibits no tenderness. No edema.     Left lower leg: He exhibits no tenderness. No edema.     Comments: No pitting edema.  Legs are symmetric bilaterally.  Skin:    General: Skin is warm and dry.    Neurological:     General: No focal deficit present.     Mental Status: He is alert.  Psychiatric:        Mood and Affect: Mood normal. Mood is not anxious.        Behavior: Behavior normal.      ED Treatments / Results  Labs (all labs ordered are listed, but only abnormal results are displayed) Labs Reviewed  BASIC METABOLIC PANEL - Abnormal; Notable for the following components:      Result Value   Potassium 3.4 (*)    Glucose, Bld 160 (*)    BUN 5 (*)    All other components within normal limits  HEPATIC FUNCTION PANEL - Abnormal; Notable for the following components:   AST 195 (*)    ALT 141 (*)    Total Bilirubin 2.4 (*)    Bilirubin, Direct 0.6 (*)    Indirect Bilirubin 1.8 (*)    All other components within normal limits  URINALYSIS, ROUTINE W REFLEX MICROSCOPIC - Abnormal; Notable for the following components:   Protein, ur 30 (*)    All other components within normal limits  RAPID URINE DRUG SCREEN, HOSP PERFORMED - Abnormal; Notable for the following components:   Cocaine POSITIVE (*)    All other components within normal limits  TROPONIN I (HIGH SENSITIVITY) - Abnormal; Notable for the following components:   Troponin I (High Sensitivity) 22 (*)    All other components within normal limits  TROPONIN I (HIGH SENSITIVITY) - Abnormal; Notable for the following components:   Troponin I (High Sensitivity) 22 (*)    All other components within normal limits  CBC  BRAIN NATRIURETIC PEPTIDE  D-DIMER, QUANTITATIVE (NOT AT [facility])  ETHANOL    EKG EKG Interpretation  Date/Time:  Tuesday May 14 2019 14:58:53 EST Ventricular Rate:  111 PR Interval:  156 QRS Duration: 76 QT Interval:  348 QTC Calculation: 473 R Axis:   142 Text Interpretation: Sinus tachycardia Right atrial enlargement Right axis deviation Cannot rule out Anterior infarct , age undetermined Abnormal ECG similar to 11/07/18 Confirmed by Raeford RazorKohut, Stephen [phone]([id number]) on 05/14/2019 4:14:45  PM   Radiology Dg Chest Port 1 View  Result Date: 05/14/2019 CLINICAL DATA:  Cough and shortness of breath.  COVID-19 positive EXAM: PORTABLE CHEST 1 VIEW COMPARISON:  Nov 07, 2018 FINDINGS: No edema or consolidation. Heart is upper normal in size with pulmonary vascularity normal. No adenopathy. No bone lesions. IMPRESSION: Stable cardiac silhouette. No edema or consolidation. No adenopathy evident. Electronically Signed   By: Bretta BangWilliam  Woodruff III M.D.  On: 05/14/2019 16:21    Procedures Procedures (including critical care time)  Medications Ordered in ED Medications - No data to display   Initial Impression / Assessment and Plan / ED Course  I have reviewed the triage vital signs and the nursing notes.  Pertinent labs & imaging results that were available during my care of the patient were reviewed by me and considered in my medical decision making (see chart for details).  Clinical Course as of May 14 28  Tue May 14, 2019  2150 I had been informed that patient's mother wished to speak with me.  Patient's mother was reportedly upset that she was not allowed to come back.  I relayed this information to patient who stated that he would take care of his contacting his mother.   [EH]    Clinical Course User Index [EH] Lorin Glass, PA-C      Patient presents today for evaluation of chest pain and mild shortness of breath he woke up this morning.      He had tested positive for Covid 1 month ago and had essentially fully recovered.  He denies any fevers or cough.  He does report excessive alcohol consumption, typically 1/5 of liquor a day.  On exam he is mildly tachycardic, however he is afebrile and not tachypneic.  Troponin x2 are both 22, patient does admit to recent cocaine use and he UDS is positive for cocaine.  I suspect that this is his baseline in the setting of cocaine use.  AST and ALT are both elevated, however improved from previous.  Alcohol is undetected.   Urine with 30 protein however otherwise unremarkable.  BNP is not elevated at 26.  Based on recent Covid infection with new sudden onset chest pain and shortness of breath concern for thromboembolism.  D-dimer is not elevated at 0.50.  Chest x-ray shows his heart is upper limits normal, EKG without evidence of acute ischemia.  Patient was observed in the emergency room without worsening condition.  His pain was fully resolved at the time of discharge and he reported that he felt better.  I suspect that his symptoms today are multifactorial.  He appears mildly hypertensive here, however has also recently used cocaine.  His lab work shows his glucose is slightly elevated and his urine with 30 protein.  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  We discussed life style and dietary changes.  He is instructed to follow up with the wellness clinic for repeat BP monitoring and to stop using cocaine.  He has sleep apnea and does not have a CPAP machine which I suspect contributes to his overall condition.  Instructed to follow up with wellness clinic.    Patient was discussed with Dr. Wilson Singer.   Return precautions were discussed with patient who states their understanding.  At the time of discharge patient denied any unaddressed complaints or concerns.  Patient is agreeable for discharge home.   Final Clinical Impressions(s) / ED Diagnoses   Final diagnoses:  Shortness of breath  Atypical chest pain  Hypertension, unspecified type  Cocaine use  Alcohol use disorder, moderate, dependence [facility])    ED Discharge Orders    None       Ollen Gross 05/15/19 0032    Virgel Manifold, MD 05/15/19 [phone]

## 2019-05-28 NOTE — Progress Notes (Signed)
Patient ID: [name], male   DOB: 09-Apr-1986, 33 y.o.   MRN: [id number]   Virtual Visit via Telephone Note  I connected with Brett Brock on 05/29/19 at  9:10 AM EST by telephone and verified that I am speaking with the correct person using two identifiers.   I discussed the limitations, risks, security and privacy concerns of performing an evaluation and management service by telephone and the availability of in person appointments. I also discussed with the patient that there may be a patient responsible charge related to this service. The patient expressed understanding and agreed to proceed.  Patient location:  home My Location:  [facility] office Persons on the call: me and the patient  (This note is not being shared with the patient for the following reason: I am uncertain why this popped up but it did and required me to sign)   History of Present Illness: After being seen in the ED 05/14/2019.  He had Covid about 1 month ago.  Still having some SOB/wheezing.  PMH:  Occasional cocaine use, sleep apnea(not using CPAP), +daily alcohol, elevated BP w/o dx htn, asthma.  Feeling better overall.  Using albuterol several times a day.  Does not have insurance.  He would like to get a CPAP machine to help with his fatigue.  No fevers.      From ED note:  He had tested positive for Covid 1 month ago and had essentially fully recovered.  He denies any fevers or cough.  He does report excessive alcohol consumption, typically 1/5 of liquor a day.  On exam he is mildly tachycardic, however he is afebrile and not tachypneic.  Troponin x2 are both 22, patient does admit to recent cocaine use and he UDS is positive for cocaine.  I suspect that this is his baseline in the setting of cocaine use.  AST and ALT are both elevated, however improved from previous.  Alcohol is undetected.  Urine with 30 protein however otherwise unremarkable.  BNP is not elevated at 26.  Based on recent Covid infection with  new sudden onset chest pain and shortness of breath concern for thromboembolism.  D-dimer is not elevated at 0.50.  Chest x-ray shows his heart is upper limits normal, EKG without evidence of acute ischemia.  Patient was observed in the emergency room without worsening condition.  His pain was fully resolved at the time of discharge and he reported that he felt better.  I suspect that his symptoms today are multifactorial.  He appears mildly hypertensive here, however has also recently used cocaine.  His lab work shows his glucose is slightly elevated and his urine with 30 protein.  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  We discussed life style and dietary changes.  He is instructed to follow up with the wellness clinic for repeat BP monitoring and to stop using cocaine.  He has sleep apnea and does not have a CPAP machine which I suspect contributes to his overall condition.  Instructed to follow up with wellness clinic.      Observations/Objective:  NAD.  A&Ox3   Assessment and Plan:   1. Moderate asthma, unspecified whether complicated, unspecified whether persistent add - Fluticasone-Salmeterol (ADVAIR DISKUS) 250-50 MCG/DOSE AEPB; Inhale 1 puff into the lungs 2 (two) times daily.  Dispense: 1 each; Refill: 3  2. Encounter for examination following treatment at hospital Come to office to obtain orange card/financial  packet  3. Elevated blood pressure reading Check BP 3 times weekly OOO and record and bring to next visit.  Patient was on cocaine when BP was elevated at BP.   4. Alcohol abuse I have counseled the patient at length about substance abuse and addiction.  12 step meetings/recovery recommended.  Local 12 step meeting lists were given and attendance was encouraged.  Patient expresses understanding.    Follow Up Instructions: Assign PCP in 1 month   I discussed the assessment and treatment plan  with the patient. The patient was provided an opportunity to ask questions and all were answered. The patient agreed with the plan and demonstrated an understanding of the instructions.   The patient was advised to call back or seek an in-person evaluation if the symptoms worsen or if the condition fails to improve as anticipated.  I provided 16 minutes of non-face-to-face time during this encounter.   Georgian Co, PA-C

## 2019-05-29 ENCOUNTER — Other Ambulatory Visit: Payer: Self-pay

## 2019-05-29 ENCOUNTER — Ambulatory Visit: Payer: Self-pay | Attending: Family Medicine | Admitting: Physician Assistant

## 2019-05-29 DIAGNOSIS — J45909 Unspecified asthma, uncomplicated: Secondary | ICD-10-CM

## 2019-05-29 DIAGNOSIS — Z09 Encounter for follow-up examination after completed treatment for conditions other than malignant neoplasm: Secondary | ICD-10-CM

## 2019-05-29 DIAGNOSIS — F1010 Alcohol abuse, uncomplicated: Secondary | ICD-10-CM

## 2019-05-29 DIAGNOSIS — R030 Elevated blood-pressure reading, without diagnosis of hypertension: Secondary | ICD-10-CM

## 2019-05-29 MED ORDER — FLUTICASONE-SALMETEROL 250-50 MCG/DOSE IN AEPB: 1.0000 | INHALATION_SPRAY | Freq: Two times a day (BID) | RESPIRATORY_TRACT | 3 refills | Status: AC

## 2019-05-29 MED FILL — !ADVAIR 250/50 DISKUS: 250-50 | 60 days supply | Qty: 60 | Fill #0

## 2019-05-29 NOTE — Progress Notes (Signed)
Pt. Still having SOB.

## 2019-07-26 ENCOUNTER — Encounter: Payer: Self-pay | Admitting: Family Medicine

## 2019-07-26 ENCOUNTER — Other Ambulatory Visit: Payer: Self-pay

## 2019-07-26 ENCOUNTER — Ambulatory Visit: Payer: Self-pay | Attending: Family Medicine | Admitting: Family Medicine

## 2019-07-26 VITALS — BP 128/85 | HR 108 | Ht 72.0 in | Wt 327.0 lb

## 2019-07-26 DIAGNOSIS — R609 Edema, unspecified: Secondary | ICD-10-CM

## 2019-07-26 DIAGNOSIS — J45909 Unspecified asthma, uncomplicated: Secondary | ICD-10-CM

## 2019-07-26 DIAGNOSIS — F1010 Alcohol abuse, uncomplicated: Secondary | ICD-10-CM

## 2019-07-26 DIAGNOSIS — R600 Localized edema: Secondary | ICD-10-CM

## 2019-07-26 DIAGNOSIS — R7989 Other specified abnormal findings of blood chemistry: Secondary | ICD-10-CM

## 2019-07-26 DIAGNOSIS — R739 Hyperglycemia, unspecified: Secondary | ICD-10-CM

## 2019-07-26 DIAGNOSIS — Z8669 Personal history of other diseases of the nervous system and sense organs: Secondary | ICD-10-CM

## 2019-07-26 MED ORDER — ALBUTEROL SULFATE HFA 108 (90 BASE) MCG/ACT IN AERS: 1.0000 | INHALATION_SPRAY | Freq: Four times a day (QID) | RESPIRATORY_TRACT | 11 refills | Status: AC | PRN

## 2019-07-26 MED ORDER — POTASSIUM CHLORIDE ER 10 MEQ PO TBCR: EXTENDED_RELEASE_TABLET | ORAL | 5 refills | Status: DC

## 2019-07-26 MED ORDER — FUROSEMIDE 20 MG PO TABS: ORAL_TABLET | ORAL | 5 refills | Status: DC

## 2019-07-26 MED ORDER — ALBUTEROL SULFATE HFA 108 (90 BASE) MCG/ACT IN AERS: 1.0000 | INHALATION_SPRAY | Freq: Four times a day (QID) | RESPIRATORY_TRACT | 11 refills | Status: DC | PRN

## 2019-07-26 MED FILL — ALBUTEROL SULFATE HFA 108 (: 108 (90 BAS | 25 days supply | Qty: 18 | Fill #0

## 2019-07-26 MED FILL — POTASSIUM CHLORIDE ER 10 ME: 10 | 30 days supply | Qty: 30 | Fill #0

## 2019-07-26 MED FILL — FUROSEMIDE 20 MG TABS: 20 | 30 days supply | Qty: 30 | Fill #0

## 2019-07-26 NOTE — Progress Notes (Signed)
Established Patient Office Visit  Subjective:  Patient ID: Brett Brock, male    DOB: 02-04-1986  Age: 34 y.o. MRN: [id number]  CC:  Chief Complaint  Patient presents with   Establish Care    HPI Brett Brock, 34 year old male, who is new to me is a patient who presents to establish care.  Patient has had telemedicine visit on 05/29/2019 with another provider in follow-up of ED visit on 05/14/2019.  Per ED notes, patient reported shortness of breath and chest pain status post Covid infection in October 2020.  Patient also at that time reported use of cocaine within 3 days of ED visit as well as daily consumption of 1/5 of liquor on a nightly basis.  EKG showed sinus tachycardia with a heart rate of 111.  On blood work done at emergency department visit, patient with a glucose of 160, potassium of 3.4, AST of 195 and ALT of 141 with T bili of 2.4        At today's visit, patient reports history of asthma as well as history of obstructive sleep apnea.  He believes that he was diagnosed with severe obstructive sleep apnea when he had a sleep study in [place] in the past but he is not sure if it has been 2 years since his last sleep study.  He does stay fatigued throughout the day and can fall asleep easily if he is sitting still and has sometimes falling asleep while standing up.  He reports that multiple people have told him that he appears to stop breathing when he is asleep.  He also occasionally wakes up with a morning headache.  Regarding alcohol use, he states that he does not drink on a daily basis but a few times a week he may have multiple drinks.  He denies any current abdominal pain and no sensation of fluid within the abdomen.  He has had recurrent issues with swelling of his lower legs and he is not sure of the cause.  Pre-Covid, he worked as a Leisure centre manager.  Work has been sparse due to COVID-19 precautions with the shutdown of restaurants and bars or limited hours.  Leg swelling  is generally nonpainful but sometimes has discomfort due to the swelling.  He has noticed no increased warmth or redness to the lower legs.  On occasions when he is able to elevate his legs, swelling does go down and swelling tends to be less in the mornings after sleep.        He does need refill of albuterol for his asthma.  He continues to have some issues with shortness of breath with exertion and asthma can be triggered by cold weather, exercise or having a respiratory infection.  He is using Advair prescribed at his last visit.  Regarding increased blood sugar on blood work done in the emergency department, he states that he tends to drink a lot of fluids which he believes is the cause of the urinary frequency he sometimes experiences.  He occasionally has a dry mouth/increased thirst but not on a regular basis.  He realizes that losing weight would help with his overall health.   Past Medical History:  Diagnosis Date   Asthma     Past Surgical History:  Procedure Laterality Date   No past surgery      Family History  Problem Relation Age of Onset   Asthma Father    CAD Neg Hx     Social History   Socioeconomic  History   Marital status: Single    Spouse name: Not on file   Number of children: Not on file   Years of education: Not on file   Highest education level: Not on file  Occupational History   Not on file  Tobacco Use   Smoking status: Never Smoker   Smokeless tobacco: Never Used  Substance and Sexual Activity   Alcohol use: Yes   Drug use: Never   Sexual activity: Not on file  Other Topics Concern   Not on file  Social History Narrative   Not on file   Social Determinants of Health   Financial Resource Strain:    Difficulty of Paying Living Expenses: Not on file  Food Insecurity:    Worried About [place] in the Last Year: Not on file   Ran Out of Food in the Last Year: Not on file  Transportation Needs:    Lack of  Transportation (Medical): Not on file   Lack of Transportation (Non-Medical): Not on file  Physical Activity:    Days of Exercise per Week: Not on file   Minutes of Exercise per Session: Not on file  Stress:    Feeling of Stress : Not on file  Social Connections:    Frequency of Communication with Friends and Family: Not on file   Frequency of Social Gatherings with Friends and Family: Not on file   Attends Religious Services: Not on file   Active Member of Clubs or Organizations: Not on file   Attends Archivist Meetings: Not on file   Marital Status: Not on file  Intimate Partner Violence:    Fear of Current or Ex-Partner: Not on file   Emotionally Abused: Not on file   Physically Abused: Not on file   Sexually Abused: Not on file    Outpatient Medications Prior to Visit  Medication Sig Dispense Refill   Fluticasone-Salmeterol (ADVAIR DISKUS) 250-50 MCG/DOSE AEPB Inhale 1 puff into the lungs 2 (two) times daily. 1 each 3   albuterol (VENTOLIN HFA) 108 (90 Base) MCG/ACT inhaler Inhale 1-2 puffs into the lungs every 6 (six) hours as needed for wheezing or shortness of breath.     No facility-administered medications prior to visit.    Allergies  Allergen Reactions   Bee Venom     Childhood allergy, tested positive for allergy    ROS Review of Systems  Constitutional: Positive for fatigue. Negative for chills and fever.  HENT: Positive for congestion, postnasal drip and rhinorrhea (clear). Negative for sore throat and trouble swallowing.   Eyes: Negative for photophobia and visual disturbance.  Cardiovascular: Positive for leg swelling. Negative for chest pain and palpitations.  Gastrointestinal: Negative for abdominal pain, blood in stool, constipation, diarrhea and nausea.  Endocrine: Positive for polyuria. Negative for polydipsia and polyphagia.  Genitourinary: Positive for frequency (when drinking more fluids). Negative for dysuria.    Musculoskeletal: Negative for arthralgias and back pain.  Neurological: Positive for headaches (occasional morning headaches). Negative for dizziness.  Hematological: Negative for adenopathy. Does not bruise/bleed easily.      Objective:    Physical Exam  Constitutional: He is oriented to person, place, and time. He appears well-developed and well-nourished.  Obese young adult male in NAD wearing mask as per office [id number] policy and patient with audible mouth breathing and recurrent sniffling. Has a nasal quality to his voice  HENT:  Edema/erythema of the buccal mucosa and posterior pharynx; cobblestoning of the posterior pharynx;  slightly narrowed posterior airway due to body habitus/large tongue base  Neck: No JVD present. No thyromegaly present.  Large neck size  Cardiovascular: Normal rate and regular rhythm.  No carotid bruit  Pulmonary/Chest: Effort normal and breath sounds normal.  Mild decrease in breath sounds in all lung fields but on wheeze or rhonchi; no increased work of breathing  Abdominal: Soft. There is no abdominal tenderness. There is no rebound and no guarding.  Truncal obesity  Musculoskeletal:        General: Edema present. No tenderness.     Cervical back: Normal range of motion and neck supple.     Comments: Bilateral non-pitting edema of the lower extremities and some coarseness, thickening of the skin suggestive of recurrent edema  Lymphadenopathy:    He has no cervical adenopathy.  Neurological: He is alert and oriented to person, place, and time.  Skin: Skin is warm and dry.  Psychiatric: He has a normal mood and affect. His behavior is normal.  Nursing note and vitals reviewed.   BP 128/85    Pulse (!) 108    Ht 6' (1.829 m)    Wt (!) 327 lb (148.3 kg)    SpO2 96%    BMI 44.35 kg/m  Wt Readings from Last 3 Encounters:  07/26/19 (!) 327 lb (148.3 kg)  05/14/19 300 lb (136.1 kg)  11/07/18 285 lb (129.3 kg)     Health Maintenance Due  Topic  Date Due   HIV Screening  11/15/2002   TETANUS/TDAP  11/15/2006    Lab Results  Component Value Date   TSH 2.480 07/26/2019   Lab Results  Component Value Date   WBC 8.4 05/14/2019   HGB 15.3 05/14/2019   HCT 46.4 05/14/2019   MCV 95.9 05/14/2019   PLT 162 05/14/2019   Lab Results  Component Value Date   NA 138 07/26/2019   K 3.6 07/26/2019   CO2 22 07/26/2019   GLUCOSE 193 (H) 07/26/2019   BUN 3 (L) 07/26/2019   CREATININE 0.64 (L) 07/26/2019   BILITOT 0.6 07/26/2019   ALKPHOS 129 (H) 07/26/2019   AST 142 (H) 07/26/2019   ALT 115 (H) 07/26/2019   PROT 7.4 07/26/2019   ALBUMIN 4.1 07/26/2019   CALCIUM 8.7 07/26/2019   ANIONGAP 12 05/14/2019   No results found for: CHOL No results found for: HDL No results found for: LDLCALC No results found for: TRIG No results found for: CHOLHDL Lab Results  Component Value Date   HGBA1C 7.6 (H) 07/26/2019      Assessment & Plan:  1. Moderate asthma, unspecified whether complicated, unspecified whether persistent Patient with moderate asthma that is likely persistent but has had some improvement after addition of Advair at his visit to establish care at this office.  He does request refill of albuterol which is provided.  He should continue daily use of Advair to help control his asthma and use albuterol on an as-needed basis.  Avoid known asthma triggers. - albuterol (VENTOLIN HFA) 108 (90 Base) MCG/ACT inhaler; Inhale 1-2 puffs into the lungs every 6 (six) hours as needed for wheezing or shortness of breath.  Dispense: 18 g; Refill: 11  2. History of sleep apnea Patient with obesity and reports witnessed apneic spells as well as morning headaches, daytime fatigue and falling asleep easily whenever he sits or stands still.  Patient will be referred for split-night sleep study and after results are known, CPAP or BiPAP can be ordered for the patient.  He is encouraged to make sure that he is applied for the [facility] assistance  program to help with the cost of ongoing medical care. - Split night study; Future  3. Peripheral edema Patient with peripheral edema which is likely multifactorial.  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  He also has obesity which may be contributing to his peripheral edema.  Will check basic metabolic panel and TSH to look for other causes such as electrolyte or renal abnormality or thyroid disorder as a contributing factor to his lower extremity edema.  Prescription provided for Lasix to take as needed but suggested that he take 1 daily for a few days initially and prescription provided for potassium chloride to take whenever he uses the Lasix to help prevent hypokalemia. - Basic Metabolic Panel - TSH - potassium chloride (KLOR-CON) 10 MEQ tablet; One pill daily with use of fluid pill  Dispense: 30 tablet; Refill: 5 - furosemide (LASIX) 20 MG tablet; One pill daily as needed for swelling  Dispense: 30 tablet; Refill: 5  4. Elevated blood sugar level Patient with elevated glucose at emergency department visit in November of last year and patient is at increased risk of diabetes due to his obesity as well as poor nutritional choices and regular use of alcohol.  We will repeat glucose as part of basic metabolic panel as patient has also had prior hypokalemia.  Hemoglobin A1c due to elevated blood sugar and increased risk of becoming diabetic.  Addendum: Hemoglobin A1c was 7.6 consistent with diagnosis of diabetes and patient is being referred to the clinical pharmacist for discussion of low carbohydrate diet, possible medications such as Metformin in addition to weight loss to help with diabetes. - Basic Metabolic Panel - Hemoglobin A1c - Amb Referral to Clinical Pharmacist  5. Elevated LFTs Patient with elevated LFTs during recent emergency department visit in November which was thought  to be related to alcoholic hepatitis/ongoing issues with alcohol dependence.  Patient will have repeat hepatic function at today's visit and referral to gastroenterology.  Patient is aware of the need to stop use of alcohol - Hepatic Function Panel - Ambulatory referral to Gastroenterology  6. Alcohol consumption binge drinking Referral made to social work to help with the patient's ongoing alcohol dependence as well as referral being placed to gastroenterology for alcoholic hepatitis/alcoholic liver disease. - Ambulatory referral to Gastroenterology   Meds ordered this encounter  Medications   DISCONTD: albuterol (VENTOLIN HFA) 108 (90 Base) MCG/ACT inhaler    Sig: Inhale 1-2 puffs into the lungs every 6 (six) hours as needed for wheezing or shortness of breath.    Dispense:  18 g    Refill:  11   DISCONTD: furosemide (LASIX) 20 MG tablet    Sig: One pill daily as needed for swelling    Dispense:  30 tablet    Refill:  5   DISCONTD: potassium chloride (KLOR-CON) 10 MEQ tablet    Sig: One pill daily with use of fluid pill    Dispense:  30 tablet    Refill:  5   potassium chloride (KLOR-CON) 10 MEQ tablet    Sig: One pill daily with use of fluid pill    Dispense:  30 tablet    Refill:  5   furosemide (LASIX) 20 MG tablet    Sig: One pill daily as needed for swelling    Dispense:  30 tablet  Refill:  5   albuterol (VENTOLIN HFA) 108 (90 Base) MCG/ACT inhaler    Sig: Inhale 1-2 puffs into the lungs every 6 (six) hours as needed for wheezing or shortness of breath.    Dispense:  18 g    Refill:  11    Follow-up: Return in about 6 weeks (around 09/06/2019).    Cain Saupe, MD

## 2019-07-26 NOTE — Progress Notes (Signed)
Sleep Apnea a major problem.  Patient has Asthma.  Swelling in legs and feet.

## 2019-07-27 LAB — BASIC METABOLIC PANEL WITH GFR
BUN/Creatinine Ratio: 5 — ABNORMAL LOW (ref 9–20)
BUN: 3 mg/dL — ABNORMAL LOW (ref 6–20)
CO2: 22 mmol/L (ref 20–29)
Calcium: 8.7 mg/dL (ref 8.7–10.2)
Chloride: 100 mmol/L (ref 96–106)
Creatinine, Ser: 0.64 mg/dL — ABNORMAL LOW (ref 0.76–1.27)
GFR calc Af Amer: 151 mL/min/1.73
GFR calc non Af Amer: 130 mL/min/1.73
Glucose: 193 mg/dL — ABNORMAL HIGH (ref 65–99)
Potassium: 3.6 mmol/L (ref 3.5–5.2)
Sodium: 138 mmol/L (ref 134–144)

## 2019-07-27 LAB — HEPATIC FUNCTION PANEL
ALT: 115 IU/L — ABNORMAL HIGH (ref 0–44)
AST: 142 IU/L — ABNORMAL HIGH (ref 0–40)
Albumin: 4.1 g/dL (ref 4.0–5.0)
Alkaline Phosphatase: 129 IU/L — ABNORMAL HIGH (ref 39–117)
Bilirubin Total: 0.6 mg/dL (ref 0.0–1.2)
Bilirubin, Direct: 0.33 mg/dL (ref 0.00–0.40)
Total Protein: 7.4 g/dL (ref 6.0–8.5)

## 2019-07-27 LAB — HEMOGLOBIN A1C
Est. average glucose Bld gHb Est-mCnc: 171 mg/dL
Hgb A1c MFr Bld: 7.6 % — ABNORMAL HIGH (ref 4.8–5.6)

## 2019-07-27 LAB — TSH: TSH: 2.48 u[IU]/mL (ref 0.450–4.500)

## 2019-07-30 ENCOUNTER — Encounter: Payer: Self-pay | Admitting: Family Medicine

## 2019-08-06 ENCOUNTER — Ambulatory Visit: Payer: Self-pay | Admitting: Pharmacist

## 2019-08-08 ENCOUNTER — Ambulatory Visit: Payer: Self-pay | Admitting: Pharmacist

## 2019-08-12 ENCOUNTER — Ambulatory Visit: Payer: Self-pay | Admitting: Pharmacist

## 2019-08-22 ENCOUNTER — Encounter: Payer: Self-pay | Admitting: Family Medicine

## 2019-09-10 ENCOUNTER — Ambulatory Visit: Payer: Self-pay | Admitting: Pharmacist

## 2019-09-12 ENCOUNTER — Ambulatory Visit: Payer: Self-pay | Admitting: Pharmacist

## 2019-09-18 ENCOUNTER — Telehealth: Payer: Self-pay | Admitting: Licensed Clinical Social Worker

## 2019-09-18 NOTE — Telephone Encounter (Signed)
Call placed to patient regarding [facility] referral. There was no option for LCSW to leave message. 

## 2019-09-24 ENCOUNTER — Ambulatory Visit: Payer: Self-pay | Admitting: Pharmacist

## 2019-10-09 ENCOUNTER — Telehealth: Payer: Self-pay | Admitting: Licensed Clinical Social Worker

## 2019-10-09 NOTE — Telephone Encounter (Signed)
Call placed to patient regarding [facility] referral. There was no option for LCSW to leave message. 

## 2019-10-14 ENCOUNTER — Ambulatory Visit: Payer: Self-pay

## 2019-10-14 ENCOUNTER — Ambulatory Visit: Payer: Self-pay | Admitting: Pharmacist

## 2019-10-16 ENCOUNTER — Ambulatory Visit: Payer: Self-pay | Admitting: Pharmacist

## 2019-10-17 ENCOUNTER — Emergency Department (HOSPITAL_BASED_OUTPATIENT_CLINIC_OR_DEPARTMENT_OTHER)
Admission: EM | Admit: 2019-10-17 | Discharge: 2019-10-17 | Disposition: A | Discharge status: Home / Self Care | Payer: Self-pay | Attending: Emergency Medicine | Admitting: Emergency Medicine

## 2019-10-17 ENCOUNTER — Encounter (HOSPITAL_BASED_OUTPATIENT_CLINIC_OR_DEPARTMENT_OTHER): Payer: Self-pay | Admitting: Emergency Medicine

## 2019-10-17 ENCOUNTER — Other Ambulatory Visit: Payer: Self-pay

## 2019-10-17 DIAGNOSIS — Z79899 Other long term (current) drug therapy: Secondary | ICD-10-CM | POA: Insufficient documentation

## 2019-10-17 DIAGNOSIS — R600 Localized edema: Secondary | ICD-10-CM

## 2019-10-17 DIAGNOSIS — R609 Edema, unspecified: Secondary | ICD-10-CM

## 2019-10-17 DIAGNOSIS — E119 Type 2 diabetes mellitus without complications: Secondary | ICD-10-CM

## 2019-10-17 DIAGNOSIS — J45909 Unspecified asthma, uncomplicated: Secondary | ICD-10-CM | POA: Insufficient documentation

## 2019-10-17 LAB — CBG MONITORING, ED: Glucose-Capillary: 210 mg/dL — ABNORMAL HIGH (ref 70–99)

## 2019-10-17 MED ORDER — METFORMIN HCL 500 MG PO TABS: 500.0000 mg | ORAL_TABLET | Freq: Two times a day (BID) | ORAL | 0 refills | Status: DC

## 2019-10-17 MED ORDER — METFORMIN HCL 500 MG PO TABS
500.0000 mg | ORAL_TABLET | Freq: Once | ORAL | Status: AC
  Administered 2019-10-17: 500 mg via ORAL
  Filled 2019-10-17: qty 1

## 2019-10-17 NOTE — ED Provider Notes (Signed)
[facility] Provider Note: [name], MD, FACEP  CSN: [id number] MRN: [id number] ARRIVAL: 10/17/19 at 0033 ROOM: [facility]12/[facility]12   CHIEF COMPLAINT  Foot Swelling   HISTORY OF PRESENT ILLNESS  10/17/19 4:01 AM Brett Brock is a 34 y.o. male who missed his appointment at the [facility] yesterday.  He was to be evaluated for diabetes and possibly started on an antihyperglycemic.  He was advised to go to an ED or urgent care instead.  Review of his records shows that his glucose has run from 126 to 193 over the past year, and was 210 on arrival here.  His hemoglobin A1c was 7.6 on 07/26/2019.  He denies any fever, nausea, vomiting, diarrhea or dizziness.  He does have a history of edema of the scrotum and lower extremities which has been ongoing for several months.  He has been tested for DVT twice which were negative.  He has been prescribed a diuretic but has not been taking it as prescribed.   Past Medical History:  Diagnosis Date  . Asthma     Past Surgical History:  Procedure Laterality Date  . No past surgery      Family History  Problem Relation Age of Onset  . Asthma Father   . CAD Neg Hx     Social History   Tobacco Use  . Smoking status: Never Smoker  . Smokeless tobacco: Never Used  Substance Use Topics  . Alcohol use: Yes  . Drug use: Never    Prior to Admission medications   Medication Sig Start Date End Date Taking? Authorizing Provider  albuterol (VENTOLIN HFA) 108 (90 Base) MCG/ACT inhaler Inhale 1-2 puffs into the lungs every 6 (six) hours as needed for wheezing or shortness of breath. 07/26/19   Fulp, Cammie, MD  Fluticasone-Salmeterol (ADVAIR DISKUS) 250-50 MCG/DOSE AEPB Inhale 1 puff into the lungs 2 (two) times daily. 05/29/19   Anders Simmonds, PA-C  metFORMIN (GLUCOPHAGE) 500 MG tablet Take 1 tablet (500 mg total) by mouth 2 (two) times daily with a meal. 10/17/19   Caldonia Leap, MD  furosemide (LASIX) 20 MG tablet  One pill daily as needed for swelling 07/26/19 10/17/19  Fulp, Cammie, MD  potassium chloride (KLOR-CON) 10 MEQ tablet One pill daily with use of fluid pill 07/26/19 10/17/19  Fulp, Cammie, MD    Allergies Bee venom   REVIEW OF SYSTEMS  Negative except as noted here or in the History of Present Illness.   PHYSICAL EXAMINATION  Initial Vital Signs Blood pressure (!) 140/96, pulse 94, temperature 98.3 F (36.8 C), temperature source Oral, resp. rate 20, height 6' (1.829 m), weight (!) 148 kg, SpO2 97 %.  Examination General: Well-developed, well-nourished male in no acute distress; appearance consistent with age of record HENT: normocephalic; atraumatic Eyes: pupils equal, round and reactive to light; extraocular muscles intact Neck: supple Heart: regular rate and rhythm Lungs: clear to auscultation bilaterally Abdomen: soft; nondistended; nontender; bowel sounds present Extremities: No deformity; full range of motion; pulses normal; edema of scrotum and lower legs Neurologic: Awake, alert and oriented; motor function intact in all extremities and symmetric; no facial droop Skin: Warm and dry Psychiatric: Normal mood and affect   RESULTS  Summary of this visit's results, reviewed and interpreted by myself:   EKG Interpretation  Date/Time:    Ventricular Rate:    PR Interval:    QRS Duration:   QT Interval:    QTC Calculation:  R Axis:     Text Interpretation:        Laboratory Studies: Results for orders placed or performed during the hospital encounter of 10/17/19 (from the past 24 hour(s))  CBG monitoring, ED     Status: Abnormal   Collection Time: 10/17/19  1:09 AM  Result Value Ref Range   Glucose-Capillary 210 (H) 70 - 99 mg/dL   Imaging Studies: No results found.  ED COURSE and MDM  Nursing notes, initial and subsequent vitals signs, including pulse oximetry, reviewed and interpreted by myself.  Vitals:   10/17/19 0103 10/17/19 0105 10/17/19 0347  BP: (!)  139/97  (!) 140/96  Pulse: (!) 102  94  Resp: (!) 22  20  Temp: 98.3 F (36.8 C)  98.3 F (36.8 C)  TempSrc: Oral  Oral  SpO2: 96%  97%  Weight:  (!) 148 kg   Height:  6' (1.829 m)    Medications  metFORMIN (GLUCOPHAGE) tablet 500 mg (has no administration in time range)    Given the patient's elevated glucose and elevated A1c I think it is reasonable to start him on Glucophage 500 mg twice daily pending follow-up with the [facility].  PROCEDURES  Procedures   ED DIAGNOSES     ICD-10-CM   1. New onset type 2 diabetes mellitus ([place])  E11.9   2. Peripheral edema  R60.9        Linn Goetze, Jenny Reichmann, MD 10/17/19 [phone]

## 2019-10-17 NOTE — ED Triage Notes (Signed)
Patient presents with complaints of bilateral lower extremity swelling and redness; states had appointment with wellness clinic today and was unable to be seen there; states he was advised to come here for eval for possible diabetes. Denies any complaints; denies fever, denies NVD, denies dizziness.

## 2019-10-23 ENCOUNTER — Ambulatory Visit: Payer: Self-pay | Admitting: Pharmacist

## 2019-10-29 ENCOUNTER — Ambulatory Visit: Payer: Self-pay

## 2019-11-06 ENCOUNTER — Ambulatory Visit: Payer: Self-pay | Attending: Family Medicine

## 2019-11-06 ENCOUNTER — Other Ambulatory Visit: Payer: Self-pay

## 2019-12-09 ENCOUNTER — Other Ambulatory Visit: Payer: Self-pay | Admitting: Pharmacist

## 2019-12-09 MED ORDER — METFORMIN HCL 500 MG PO TABS: 500.0000 mg | ORAL_TABLET | Freq: Two times a day (BID) | ORAL | 0 refills | Status: AC

## 2019-12-09 MED FILL — METFORMIN HCL 500 MG TABS: 500 | 30 days supply | Qty: 60 | Fill #0

## 2020-03-12 MED FILL — METFORMIN HCL 500 MG TABS: 500 | 30 days supply | Qty: 60 | Fill #0

## 2021-01-16 IMAGING — DX DG CHEST 1V PORT
1 series · 1 of 1 positions shown · non-contrast
Comparison: November 07, 2018

CLINICAL DATA: Cough and shortness of breath.  [id number] positive

EXAM:
PORTABLE CHEST 1 VIEW

[chest]
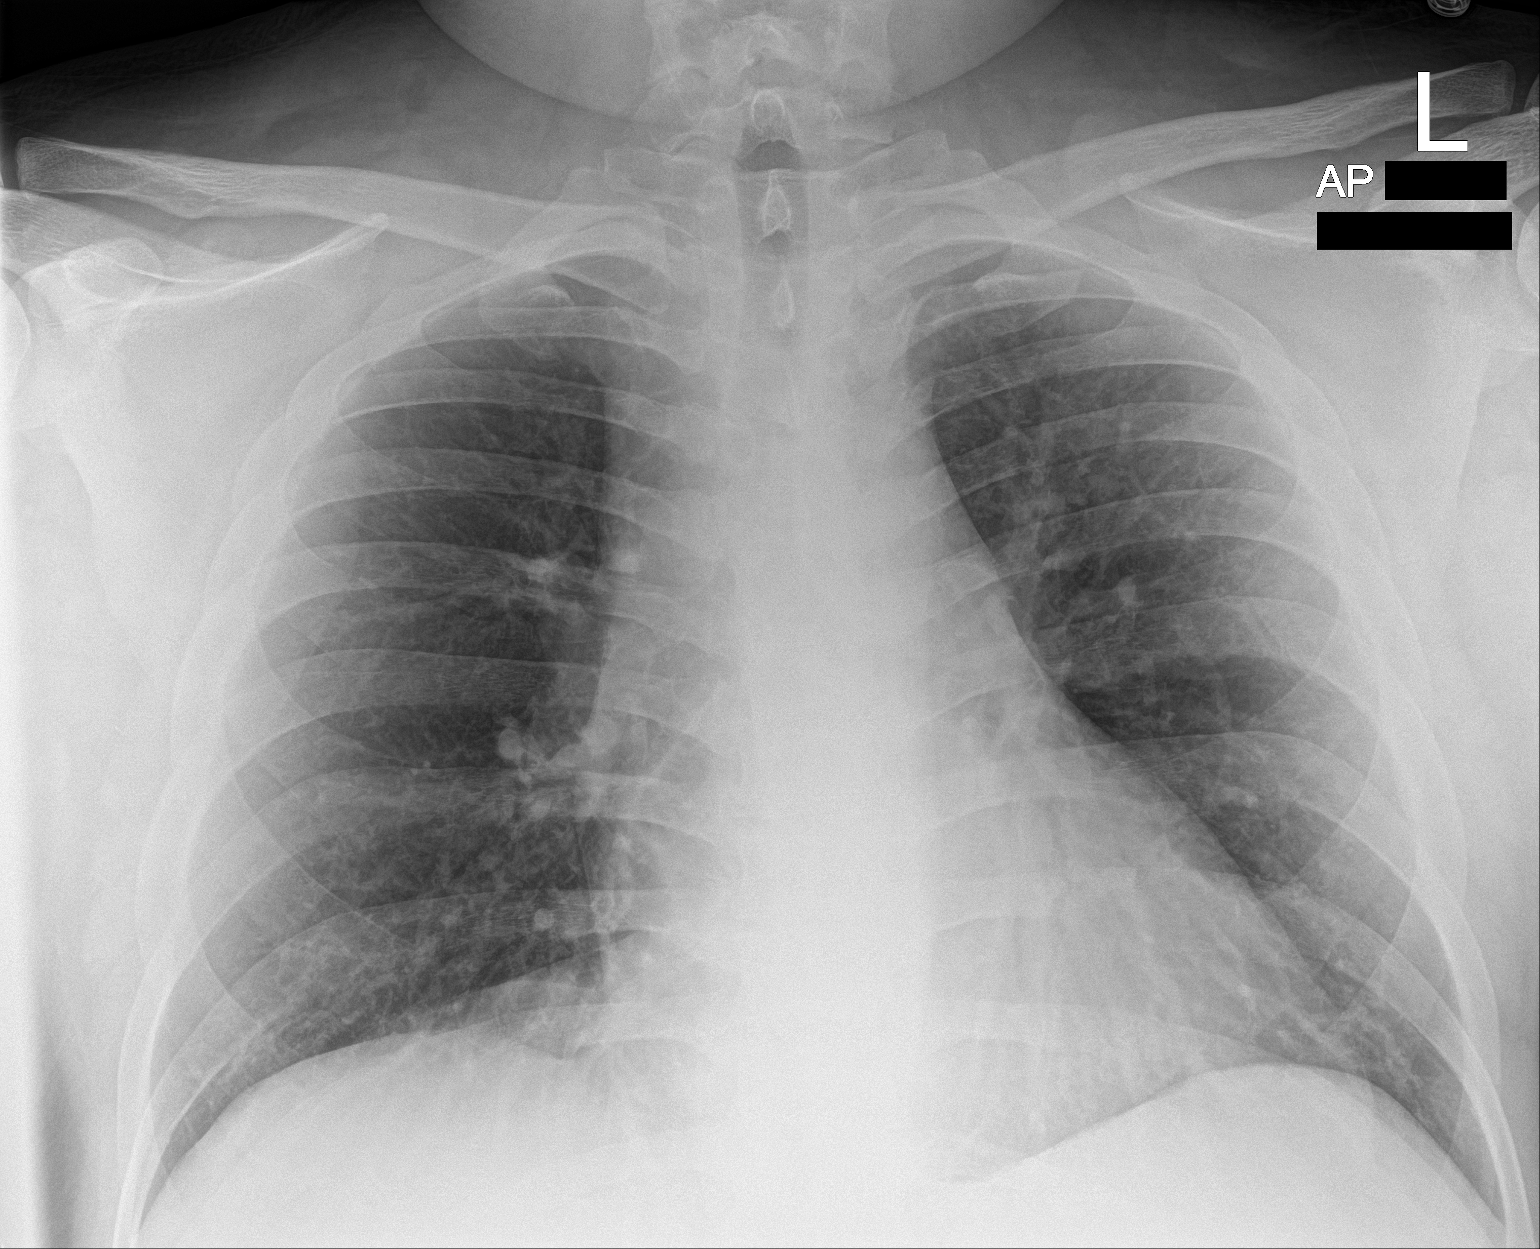

[1 of 1 positions shown; findings below may reference images not displayed]

FINDINGS: No edema or consolidation. Heart is upper normal in size with
pulmonary vascularity normal. No adenopathy. No bone lesions.
IMPRESSION: Stable cardiac silhouette. No edema or consolidation. No adenopathy
evident.

## 2024-01-24 ENCOUNTER — Other Ambulatory Visit: Payer: Self-pay

## 2024-01-24 ENCOUNTER — Encounter (HOSPITAL_COMMUNITY): Payer: Self-pay

## 2024-01-24 ENCOUNTER — Emergency Department (HOSPITAL_COMMUNITY): Payer: Self-pay

## 2024-01-24 ENCOUNTER — Emergency Department (HOSPITAL_COMMUNITY)
Admission: EM | Admit: 2024-01-24 | Discharge: 2024-01-24 | Disposition: A | Discharge status: Home / Self Care | Payer: Self-pay | Attending: Emergency Medicine | Admitting: Emergency Medicine

## 2024-01-24 DIAGNOSIS — R600 Localized edema: Secondary | ICD-10-CM | POA: Insufficient documentation

## 2024-01-24 DIAGNOSIS — Z7951 Long term (current) use of inhaled steroids: Secondary | ICD-10-CM | POA: Insufficient documentation

## 2024-01-24 DIAGNOSIS — J45909 Unspecified asthma, uncomplicated: Secondary | ICD-10-CM | POA: Diagnosis not present

## 2024-01-24 DIAGNOSIS — I4891 Unspecified atrial fibrillation: Secondary | ICD-10-CM | POA: Insufficient documentation

## 2024-01-24 DIAGNOSIS — E876 Hypokalemia: Secondary | ICD-10-CM | POA: Insufficient documentation

## 2024-01-24 DIAGNOSIS — N5089 Other specified disorders of the male genital organs: Secondary | ICD-10-CM | POA: Diagnosis present

## 2024-01-24 LAB — CBC WITH DIFFERENTIAL/PLATELET
Abs Immature Granulocytes: 0.03 K/uL (ref 0.00–0.07)
Basophils Absolute: 0.2 K/uL — ABNORMAL HIGH (ref 0.0–0.1)
Basophils Relative: 2 %
Eosinophils Absolute: 0.4 K/uL (ref 0.0–0.5)
Eosinophils Relative: 4 %
HCT: 48.6 % (ref 39.0–52.0)
Hemoglobin: 16 g/dL (ref 13.0–17.0)
Immature Granulocytes: 0 %
Lymphocytes Relative: 28 %
Lymphs Abs: 2.3 K/uL (ref 0.7–4.0)
MCH: 31.1 pg (ref 26.0–34.0)
MCHC: 32.9 g/dL (ref 30.0–36.0)
MCV: 94.6 fL (ref 80.0–100.0)
Monocytes Absolute: 0.9 K/uL (ref 0.1–1.0)
Monocytes Relative: 11 %
Neutro Abs: 4.4 K/uL (ref 1.7–7.7)
Neutrophils Relative %: 55 %
Platelets: 211 K/uL (ref 150–400)
RBC: 5.14 MIL/uL (ref 4.22–5.81)
RDW: 14.4 % (ref 11.5–15.5)
WBC: 8.2 K/uL (ref 4.0–10.5)
nRBC: 0 % (ref 0.0–0.2)

## 2024-01-24 LAB — COMPREHENSIVE METABOLIC PANEL WITH GFR
ALT: 70 U/L — ABNORMAL HIGH (ref 0–44)
AST: 128 U/L — ABNORMAL HIGH (ref 15–41)
Albumin: 3.4 g/dL — ABNORMAL LOW (ref 3.5–5.0)
Alkaline Phosphatase: 101 U/L (ref 38–126)
Anion gap: 13 (ref 5–15)
BUN: 6 mg/dL (ref 6–20)
CO2: 24 mmol/L (ref 22–32)
Calcium: 9.2 mg/dL (ref 8.9–10.3)
Chloride: 99 mmol/L (ref 98–111)
Creatinine, Ser: 0.64 mg/dL (ref 0.61–1.24)
GFR, Estimated: >60 mL/min (ref >60)
Glucose, Bld: 107 mg/dL — ABNORMAL HIGH (ref 70–99)
Potassium: 3.1 mmol/L — ABNORMAL LOW (ref 3.5–5.1)
Sodium: 136 mmol/L (ref 135–145)
Total Bilirubin: 1.8 mg/dL — ABNORMAL HIGH (ref 0.0–1.2)
Total Protein: 8.1 g/dL (ref 6.5–8.1)

## 2024-01-24 LAB — URINALYSIS, ROUTINE W REFLEX MICROSCOPIC
Bacteria, UA: NONE SEEN
Bilirubin Urine: NEGATIVE
Glucose, UA: NEGATIVE mg/dL
Hgb urine dipstick: NEGATIVE
Ketones, ur: NEGATIVE mg/dL
Leukocytes,Ua: NEGATIVE
Nitrite: NEGATIVE
Protein, ur: 100 mg/dL — AB
Specific Gravity, Urine: 1.02 (ref 1.005–1.030)
pH: 5 (ref 5.0–8.0)

## 2024-01-24 LAB — BRAIN NATRIURETIC PEPTIDE: B Natriuretic Peptide: 19.6 pg/mL (ref 0.0–100.0)

## 2024-01-24 MED ORDER — POTASSIUM CHLORIDE CRYS ER 20 MEQ PO TBCR: 20.0000 meq | EXTENDED_RELEASE_TABLET | Freq: Every day | ORAL | 0 refills | Status: AC

## 2024-01-24 MED ORDER — FUROSEMIDE 20 MG PO TABS
40.0000 mg | ORAL_TABLET | Freq: Once | ORAL | Status: AC
  Administered 2024-01-24: 40 mg via ORAL
  Filled 2024-01-24: qty 2

## 2024-01-24 MED ORDER — POTASSIUM CHLORIDE CRYS ER 20 MEQ PO TBCR
40.0000 meq | EXTENDED_RELEASE_TABLET | Freq: Once | ORAL | Status: AC
  Administered 2024-01-24: 40 meq via ORAL
  Filled 2024-01-24: qty 2

## 2024-01-24 MED ORDER — FUROSEMIDE 20 MG PO TABS: 20.0000 mg | ORAL_TABLET | Freq: Every day | ORAL | 0 refills | Status: AC

## 2024-01-24 NOTE — Discharge Instructions (Signed)
 You have been evaluated for your symptoms.  You are experiencing scrotal edema likely from fluid retention.  It is important for you to follow-up with cardiology office for further assessment as you may benefit from an ultrasound of your heart for further assessment.  You are also were noted to have new onset atrial fibrillation, a condition that can cause the heart to function suboptimally does can lead to heart failure.  It is important to be assessed further by cardiology team.  Please keep your scrotum elevated while resting, take potassium supplementation as well as fluid pill as prescribed and return if you have any concern.

## 2024-01-24 NOTE — ED Provider Notes (Signed)
 [place] EMERGENCY DEPARTMENT AT [facility] Provider Note   CSN: [id number] Arrival date & time: 01/24/24  9079     Patient presents with: Groin Swelling   Brett Brock is a 38 y.o. male.   He was sent, thank, I did just see if likely higher  The history is provided by the patient and medical records. No language interpreter was used.     38 year old male with history of asthma and history of alcohol abuse presenting with complaints of scrotal swelling.  Patient endorsed progressive worsening swelling to his scrotum ongoing for over a year.  He also noticed enlarged lymph nodes around his groin area for the same duration.  He endorsed a mild testicular discomfort and pain but denies any severe pain.  He does not endorse any fever or chills no chest pain or shortness of breath no abdominal pain or back pain and no new sexual partner.  Admits to regular alcohol use.  He also was told that he is prediabetic.  He does not have a PCP and have not follow-up with anyone for his complaint both due to lack of medical access and states due to embarrassment.  He denies any trauma.  Denies any fever night sweats or weight loss.  No prior history of STI.  Prior to Admission medications   Medication Sig Start Date End Date Taking? Authorizing Provider  albuterol  (VENTOLIN  HFA) 108 (90 Base) MCG/ACT inhaler Inhale 1-2 puffs into the lungs every 6 (six) hours as needed for wheezing or shortness of breath. 07/26/19   Fulp, Cammie, MD  Fluticasone -Salmeterol (ADVAIR DISKUS) 250-50 MCG/DOSE AEPB Inhale 1 puff into the lungs 2 (two) times daily. 05/29/19   Danton Jon HERO, PA-C  metFORMIN  (GLUCOPHAGE ) 500 MG tablet Take 1 tablet (500 mg total) by mouth 2 (two) times daily with a meal. 12/09/19   Fulp, Cammie, MD  furosemide  (LASIX ) 20 MG tablet One pill daily as needed for swelling 07/26/19 10/17/19  Fulp, Lannie, MD  potassium chloride  (KLOR-CON ) 10 MEQ tablet One pill daily with use of fluid pill  07/26/19 10/17/19  Alec Lannie, MD    Allergies: Bee venom    Review of Systems  All other systems reviewed and are negative.   Updated Vital Signs BP (!) 121/57 (BP Location: Right Arm)   Pulse (!) 106   Temp 97.8 F (36.6 C) (Oral)   Resp (!) 29   Ht 6' (1.829 m)   Wt 136.1 kg   SpO2 96%   BMI 40.69 kg/m   Physical Exam Constitutional:      General: He is not in acute distress.    Appearance: He is well-developed. He is obese.  HENT:     Head: Atraumatic.  Eyes:     Conjunctiva/sclera: Conjunctivae normal.  Cardiovascular:     Rate and Rhythm: Tachycardia present.     Pulses: Normal pulses.     Heart sounds: Normal heart sounds.  Pulmonary:     Breath sounds: No wheezing, rhonchi or rales.  Abdominal:     Tenderness: There is no abdominal tenderness.  Genitourinary:    Comments: Chaperone present during exam.  Difficult to palpate for inguinal lymphopathy but no obvious inguinal hernia noted.  Markedly edematous and enlarged scrotum the size of a volleyball with a retracted penis into the scrotal wall.  The scrotum is mildly tender without significant warmth or erythema.  Some macerated skin changes along the thigh fold bilaterally Musculoskeletal:     Cervical back:  Normal range of motion and neck supple.  Skin:    Findings: No rash.  Neurological:     Mental Status: He is alert.     (all labs ordered are listed, but only abnormal results are displayed) Labs Reviewed  CBC WITH DIFFERENTIAL/PLATELET - Abnormal; Notable for the following components:      Result Value   Basophils Absolute 0.2 (*)    All other components within normal limits  COMPREHENSIVE METABOLIC PANEL WITH GFR - Abnormal; Notable for the following components:   Potassium 3.1 (*)    Glucose, Bld 107 (*)    Albumin 3.4 (*)    AST 128 (*)    ALT 70 (*)    Total Bilirubin 1.8 (*)    All other components within normal limits  URINALYSIS, ROUTINE W REFLEX MICROSCOPIC - Abnormal; Notable for  the following components:   Color, Urine AMBER (*)    Protein, ur 100 (*)    All other components within normal limits  BRAIN NATRIURETIC PEPTIDE    EKG: EKG Interpretation Date/Time:  Wednesday January 24 2024 09:59:01 EDT Ventricular Rate:  105 PR Interval:    QRS Duration:  112 QT Interval:  376 QTC Calculation: 497 R Axis:   33  Text Interpretation: new Atrial fibrillation Consider anterior infarct Confirmed by Doretha Folks ([id number]) on 01/24/2024 10:10:33 AM  Radiology: US  SCROTUM W/DOPPLER Result Date: 01/24/2024 CLINICAL DATA:  edema.  Suprapubic pain. EXAM: SCROTAL ULTRASOUND DOPPLER ULTRASOUND OF THE TESTICLES TECHNIQUE: Complete ultrasound examination of the testicles, epididymis, and other scrotal structures was performed. Color and spectral Doppler ultrasound were also utilized to evaluate blood flow to the testicles. COMPARISON:  None Available. FINDINGS: Right testicle Measurements: 3.7 x 3.9 x 5.2 cm. No mass or microlithiasis visualized. Left testicle Measurements: 3.4 x 3.7 x 5.3 cm. No mass or microlithiasis visualized. Right epididymis:  Normal in size and appearance. Left epididymis: A 10 x 11 x 13 mm simple cyst noted in the epididymal head. Otherwise unremarkable left epididymis. Hydrocele:  Bilateral small-to-moderate hydrocele noted. Varicocele: Bilateral moderately dilated pampiniform plexus of veins, suggesting varicoceles. Pulsed Doppler interrogation of both testes demonstrates normal low resistance arterial and venous waveforms bilaterally. Note is made of moderate-to-severe diffuse scrotal wall swelling/edema. No discrete intra scrotal abscess seen on the provided images. IMPRESSION: 1. Unremarkable bilateral testes. No evidence of testicular torsion on either side. 2. Moderate-to-severe diffuse scrotal wall swelling/edema. No discrete intra scrotal abscess seen on the provided images. 3. Bilateral small-to-moderate hydrocele. 4. Bilateral varicoceles. Electronically  Signed   By: Ree Molt M.D.   On: 01/24/2024 11:21   DG Chest 2 View Result Date: 01/24/2024 CLINICAL DATA:  No acute EXAM: CHEST - 2 VIEW COMPARISON:  05/14/2019. FINDINGS: Trachea is midline. Heart is at the upper limits of normal in size to mildly enlarged. Lungs are clear. No pleural fluid. IMPRESSION: No acute findings. Electronically Signed   By: Newell Eke M.D.   On: 01/24/2024 10:53     Procedures   Medications Ordered in the ED  furosemide  (LASIX ) tablet 40 mg (40 mg Oral Given 01/24/24 1333)  potassium chloride  SA (KLOR-CON  M) CR tablet 40 mEq (40 mEq Oral Given 01/24/24 1333)                                    Medical Decision Making Amount and/or Complexity of Data Reviewed Labs: ordered. Radiology: ordered.  Risk Prescription  drug management.   BP (!) 142/91   Pulse (!) 115   Temp 97.6 F (36.4 C)   Resp 17   Ht 6' (1.829 m)   Wt 136.1 kg   SpO2 99%   BMI 40.69 kg/m   26:71 AM 38 year old male with history of asthma and history of alcohol abuse presenting with complaints of scrotal swelling.  Patient endorsed progressive worsening swelling to his scrotum ongoing for over a year.  He also noticed enlarged lymph nodes around his groin area for the same duration.  He endorsed a mild testicular discomfort and pain but denies any severe pain.  He does not endorse any fever or chills no chest pain or shortness of breath no abdominal pain or back pain and no new sexual partner.  Admits to regular alcohol use.  He also was told that he is prediabetic.  He does not have a PCP and have not follow-up with anyone for his complaint both due to lack of medical access and states due to embarrassment.  He denies any trauma.  Denies any fever night sweats or weight loss.  No prior history of STI.  Exam notable for a markedly enlarged scrotum the size of a wall.  His penis is retracted into the scrotal wall which makes it difficult to visualize.  Patient is obese but does not  have significant peripheral edema with exception of his scrotal edema.  Workup initiated.  -Labs ordered, independently viewed and interpreted by me.  Labs remarkable for 3.1, supplementation given.  Evidence of mild transaminitis with AST 128, ALT 70, total bili of 1.8.  This is similar to previous elevated liver enzyme likely in the setting of recurrent alcohol use -The patient was maintained on a cardiac monitor.  I personally viewed and interpreted the cardiac monitored which showed an underlying rhythm of: Atrial fibrillation, new onset.  Patient CHA2DS2-VASc score is 0 therefore we will hold off on starting anticoagulant at this time -Imaging independently viewed and interpreted by me and I agree with radiologist's interpretation.  Result remarkable for Scrotal ultrasound without any evidence of testicle torsion.  Patient does have moderate to severe diffuse scrotal wall swelling and edema without any signs of abscess.  Patient also has mild to moderate hydrocele bilaterally with bilateral varicocele.  These findings discussed with the patient -This patient presents to the ED for concern of scrotal swelling, this involves an extensive number of treatment options and is a complaint that carries with it a high risk of complications and morbidity.  The differential diagnosis includes dependent edema, testicular torsion, scrotal abscess, epididymitis, cellulitis,  -Co morbidities that complicate the patient evaluation includes alcohol abuse -Treatment includes lasix , potassium -Reevaluation of the patient after these medicines showed that the patient stayed the same -PCP office notes or outside notes reviewed -Escalation to admission/observation considered: patients feels much better, is comfortable with discharge, and will follow up with PCP and afib clinic -Prescription medication considered, patient comfortable with lasix , potassium -Social Determinant of Health considered which includes alcohol  abuse  I am concerned that patient may have dilated cardiomyopathy in the setting of chronic alcohol use causing dependent scrotal edema.  Patient likely would benefit from echocardiogram for further assessment.  Will get patient referred to the A-fib clinic and gave patient return precaution.  I also encouraged patient to keep his scrotum elevated to help with edema.      Final diagnoses:  Scrotal swelling  Atrial fibrillation, new onset (HCC)  Hypokalemia  ED Discharge Orders          Ordered    Amb Referral to AFIB Clinic        01/24/24 1305    furosemide  (LASIX ) 20 MG tablet  Daily        01/24/24 1421    potassium chloride  SA (KLOR-CON  M) 20 MEQ tablet  Daily        01/24/24 1421               Nivia Colon, PA-C 01/24/24 1421    Doretha Folks, MD 01/27/24 [phone]

## 2024-01-24 NOTE — ED Triage Notes (Signed)
 PT arrived via POV today endorsing swelling of his groin that has been ongoing for roughly a year with dramatic increase in past  2 months. Endorses pain of 3/10. Denies pain, burning or increased frequency with urination. Aox4. Endorses Endorses frequent alcohol use, denies any withdrawal Hx.
